# Patient Record
Sex: Male | Born: 1960 | Race: Black or African American | Hispanic: No | Marital: Married | Smoking: Former smoker
Health system: Southern US, Community
[De-identification: ages and names within clinical notes are randomized; demographics above are authoritative.]

## PROBLEM LIST (undated history)

## (undated) DIAGNOSIS — I509 Heart failure, unspecified: Secondary | ICD-10-CM

## (undated) DIAGNOSIS — I10 Essential (primary) hypertension: Secondary | ICD-10-CM

## (undated) DIAGNOSIS — I2510 Atherosclerotic heart disease of native coronary artery without angina pectoris: Secondary | ICD-10-CM

## (undated) DIAGNOSIS — G4730 Sleep apnea, unspecified: Secondary | ICD-10-CM

## (undated) DIAGNOSIS — J45909 Unspecified asthma, uncomplicated: Secondary | ICD-10-CM

## (undated) HISTORY — PX: APPENDECTOMY: SHX54

## (undated) HISTORY — PX: CORONARY ANGIOPLASTY WITH STENT PLACEMENT: SHX49

---

## 2004-01-28 ENCOUNTER — Emergency Department (HOSPITAL_COMMUNITY): Admission: EM | Admit: 2004-01-28 | Discharge: 2004-01-28 | Payer: Self-pay | Admitting: Emergency Medicine

## 2009-12-12 ENCOUNTER — Inpatient Hospital Stay (HOSPITAL_COMMUNITY): Admission: EM | Admit: 2009-12-12 | Discharge: 2009-12-16 | Payer: Self-pay | Admitting: Emergency Medicine

## 2009-12-12 ENCOUNTER — Ambulatory Visit: Payer: Self-pay | Admitting: Cardiology

## 2009-12-13 ENCOUNTER — Encounter (INDEPENDENT_AMBULATORY_CARE_PROVIDER_SITE_OTHER): Payer: Self-pay | Admitting: Internal Medicine

## 2010-01-23 ENCOUNTER — Emergency Department (HOSPITAL_COMMUNITY): Admission: EM | Admit: 2010-01-23 | Discharge: 2010-01-23 | Payer: Self-pay | Admitting: Family Medicine

## 2010-08-14 ENCOUNTER — Ambulatory Visit (INDEPENDENT_AMBULATORY_CARE_PROVIDER_SITE_OTHER): Payer: Self-pay

## 2010-08-14 ENCOUNTER — Inpatient Hospital Stay (INDEPENDENT_AMBULATORY_CARE_PROVIDER_SITE_OTHER)
Admission: RE | Admit: 2010-08-14 | Discharge: 2010-08-14 | Disposition: A | Discharge status: Home / Self Care | Payer: Self-pay | Source: Ambulatory Visit

## 2010-08-14 DIAGNOSIS — R609 Edema, unspecified: Secondary | ICD-10-CM

## 2010-08-14 DIAGNOSIS — J811 Chronic pulmonary edema: Secondary | ICD-10-CM

## 2010-08-15 LAB — POCT I-STAT, CHEM 8
BUN: 8 mg/dL (ref 6–23)
Chloride: 104 mEq/L (ref 96–112)
Creatinine, Ser: 1.5 mg/dL (ref 0.4–1.5)
Glucose, Bld: 92 mg/dL (ref 70–99)
Hemoglobin: 16.7 g/dL (ref 13.0–17.0)
Potassium: 3.5 mEq/L (ref 3.5–5.1)
Sodium: 143 mEq/L (ref 135–145)
TCO2: 29 mmol/L (ref 0–100)

## 2010-08-17 ENCOUNTER — Emergency Department (HOSPITAL_COMMUNITY): Payer: Self-pay

## 2010-08-17 ENCOUNTER — Inpatient Hospital Stay (HOSPITAL_COMMUNITY)
Admission: EM | Admit: 2010-08-17 | Discharge: 2010-08-21 | DRG: 286 | Disposition: A | Discharge status: Home / Self Care | Payer: Self-pay | Attending: Internal Medicine | Admitting: Internal Medicine

## 2010-08-17 DIAGNOSIS — E669 Obesity, unspecified: Secondary | ICD-10-CM | POA: Diagnosis present

## 2010-08-17 DIAGNOSIS — I509 Heart failure, unspecified: Secondary | ICD-10-CM | POA: Diagnosis present

## 2010-08-17 DIAGNOSIS — E785 Hyperlipidemia, unspecified: Secondary | ICD-10-CM | POA: Diagnosis present

## 2010-08-17 DIAGNOSIS — I4729 Other ventricular tachycardia: Secondary | ICD-10-CM | POA: Diagnosis not present

## 2010-08-17 DIAGNOSIS — Z7982 Long term (current) use of aspirin: Secondary | ICD-10-CM

## 2010-08-17 DIAGNOSIS — I130 Hypertensive heart and chronic kidney disease with heart failure and stage 1 through stage 4 chronic kidney disease, or unspecified chronic kidney disease: Principal | ICD-10-CM | POA: Diagnosis present

## 2010-08-17 DIAGNOSIS — I472 Ventricular tachycardia, unspecified: Secondary | ICD-10-CM | POA: Diagnosis not present

## 2010-08-17 DIAGNOSIS — Z9119 Patient's noncompliance with other medical treatment and regimen: Secondary | ICD-10-CM

## 2010-08-17 DIAGNOSIS — E662 Morbid (severe) obesity with alveolar hypoventilation: Secondary | ICD-10-CM | POA: Diagnosis present

## 2010-08-17 DIAGNOSIS — N189 Chronic kidney disease, unspecified: Principal | ICD-10-CM | POA: Diagnosis present

## 2010-08-17 DIAGNOSIS — I5033 Acute on chronic diastolic (congestive) heart failure: Secondary | ICD-10-CM | POA: Diagnosis present

## 2010-08-17 DIAGNOSIS — I43 Cardiomyopathy in diseases classified elsewhere: Secondary | ICD-10-CM | POA: Diagnosis present

## 2010-08-17 DIAGNOSIS — Z91199 Patient's noncompliance with other medical treatment and regimen due to unspecified reason: Secondary | ICD-10-CM

## 2010-08-17 DIAGNOSIS — G4733 Obstructive sleep apnea (adult) (pediatric): Secondary | ICD-10-CM | POA: Diagnosis present

## 2010-08-17 DIAGNOSIS — N183 Chronic kidney disease, stage 3 unspecified: Secondary | ICD-10-CM | POA: Diagnosis present

## 2010-08-17 LAB — URINALYSIS, ROUTINE W REFLEX MICROSCOPIC
Bilirubin Urine: NEGATIVE
Ketones, ur: NEGATIVE mg/dL
Leukocytes, UA: NEGATIVE
Nitrite: NEGATIVE
Protein, ur: NEGATIVE mg/dL
Specific Gravity, Urine: 1.012 (ref 1.005–1.030)
Urine Glucose, Fasting: NEGATIVE mg/dL

## 2010-08-17 LAB — DIFFERENTIAL
Basophils Absolute: 0 10*3/uL (ref 0.0–0.1)
Eosinophils Absolute: 0.1 10*3/uL (ref 0.0–0.7)
Eosinophils Relative: 1 % (ref 0–5)
Lymphocytes Relative: 33 % (ref 12–46)
Monocytes Absolute: 0.5 10*3/uL (ref 0.1–1.0)
Monocytes Relative: 7 % (ref 3–12)
Neutro Abs: 4 10*3/uL (ref 1.7–7.7)
Neutrophils Relative %: 58 % (ref 43–77)

## 2010-08-17 LAB — POCT CARDIAC MARKERS: CKMB, poc: 3.7 ng/mL (ref 1.0–8.0)

## 2010-08-17 LAB — COMPREHENSIVE METABOLIC PANEL
ALT: 42 U/L (ref 0–53)
Albumin: 3.7 g/dL (ref 3.5–5.2)
CO2: 26 mEq/L (ref 19–32)
Chloride: 103 mEq/L (ref 96–112)
GFR calc non Af Amer: >60 mL/min (ref >60)
Glucose, Bld: 118 mg/dL — ABNORMAL HIGH (ref 70–99)
Sodium: 140 mEq/L (ref 135–145)
Total Protein: 6.9 g/dL (ref 6.0–8.3)

## 2010-08-17 LAB — CARDIAC PANEL(CRET KIN+CKTOT+MB+TROPI)
CK, MB: 6.4 ng/mL (ref 0.3–4.0)
Relative Index: 0.3 (ref 0.0–2.5)
Total CK: 1881 U/L — ABNORMAL HIGH (ref 7–232)
Troponin I: 0.1 ng/mL — ABNORMAL HIGH (ref 0.00–0.06)

## 2010-08-17 LAB — CBC
MCV: 85.7 fL (ref 78.0–100.0)
RBC: 5.1 MIL/uL (ref 4.22–5.81)

## 2010-08-17 LAB — TSH: TSH: 1.261 u[IU]/mL (ref 0.350–4.500)

## 2010-08-17 LAB — CK TOTAL AND CKMB (NOT AT ARMC): Total CK: 1947 U/L — ABNORMAL HIGH (ref 7–232)

## 2010-08-17 LAB — D-DIMER, QUANTITATIVE: D-Dimer, Quant: <0.22 ug/mL-FEU (ref 0.00–0.48)

## 2010-08-17 LAB — TROPONIN I: Troponin I: 0.08 ng/mL — ABNORMAL HIGH (ref 0.00–0.06)

## 2010-08-17 NOTE — H&P (Signed)
NAME:  Joel Silva, SEIDMAN NO.:  [id number]  MEDICAL RECORD NO.:  [id number]           PATIENT TYPE:  E  LOCATION:  [facility]                         FACILITY:  [facility]  PHYSICIAN:  Brendia Sacks, MD    DATE OF BIRTH:  21-Sep-1960  DATE OF ADMISSION:  08/17/2010 DATE OF DISCHARGE:                             HISTORY & PHYSICAL   REFERRING PHYSICIAN:  Jerelyn Scott, MD  PRIMARY CARE PHYSICIAN:  [facility], he cannot recall the name of the physician.  CHIEF COMPLAINT:  Shortness of breath.  HISTORY OF PRESENT ILLNESS:  This is a 50 year old man who presents to the emergency room with subacute history of shortness of breath.  The patient reports shortness of breath over the last month to 6 weeks with increased dyspnea on exertion and increased orthopnea.  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  He has been out of his medication for approximately a month.  He has been on medication for hypertension in the past.  Review of the patient's record notes he was seen by cardiology in June during admission for shortness of breath as well.  At that time final diagnoses were shortness of breath, cardiac versus noncardiac with an ejection fraction of 45-50% per cardiology, however, the official report reads higher as well as poorly controlled hypertension and transaminitis.  The patient did have a component of diastolic dysfunction on echocardiogram done at that time.  It was also noted that he probably had obstructive sleep apnea, however, he did not pursue a sleep study.  The patient has had no chest pain or other symptoms.  He has just noted increased dyspnea on exertion to the point where he was having difficulty doing his job.  He was recently seen in the emergency room on the 9th and discharged home.  REVIEW OF SYSTEMS:  Negative for fever, changes to his vision, sore throat, rash, muscle aches, chest pain, nausea, vomiting,  abdominal pain, diarrhea, dysuria, bleeding.  PAST MEDICAL HISTORY: 1. Hypertension. 2. Noncompliance with medication. 3. Probable obstructive sleep apnea and obesity. 4. Hypoventilation syndrome.  PAST SURGICAL HISTORY:  Appendectomy, age 37.  ALLERGIES:  None.  SOCIAL HISTORY:  He is a nonsmoker, no drugs.  Works as a Naval architect, doing Art gallery manager, he only drinks, and he is not working.  FAMILY HISTORY:  Negative for first-degree coronary artery disease.  MEDICATIONS: 1. Lisinopril 10 mg p.o. daily. 2. Lasix 40 mg p.o. daily. 3. Potassium chloride 10 mEq p.o. daily.  He only started these     medications 3 days ago.  PHYSICAL EXAMINATION:  VITAL SIGNS:  Initial blood pressure 192/154 with a pulse of 101 and respirations of 24, temperature 97.5, most recent blood pressure 163/91, sat 98% on room air. GENERAL: Well-developed, well-nourished man in no acute distress. HEAD:  Appears to be normal. EYES:  Sclerae clear.  Pupils equal, round, reactive to light with irises, conjunctive appear normal. ENT:  Hearing is grossly normal.  Lips and tongue appear unremarkable. NECK:  Supple.  No lymphadenopathy or masses.  No thyromegaly. CHEST:  Clear to auscultation bilaterally.  No wheezes, rales, or rhonchi.  There is normal respiratory effort. EXTREMITIES:  There is 1+ bilateral lower extremity edema. ABDOMEN:  Obese, soft, nontender, nondistended. PSYCHIATRIC:  Grossly normal mood and affect.  Speech is fluent and appropriate.  He is a good historian.  IMAGING:  Chest x-ray:  Cardiomegaly and pulmonary vascular congestion. Diffuse mild interstitial prominence may reflect mild interstitial edema.  ANCILLARY STUDIES:  EKG independently reviewed February 12 at 7:43 a.m. shows normal sinus rhythm, right atrial enlargement, nonspecific ST changes compared to previous tracing from August 14, 2010, right atrial enlargement is old, nonspecific ST changes are old as  well.  LABORATORY STUDIES: 1. CBC is within normal limits. 2. Complete metabolic panel notable for an AST of 80, otherwise     unremarkable. 3. One set of cardiac markers are negative. 4. Urinalysis is negative.  ASSESSMENT/PLAN:  A 50 year old man presents with shortness of breath: 1. Uncontrolled hypertension with hypertensive urgency and shortness     of breath.  We will admit to telemetry.  His blood pressure has     been improved in the emergency room already.  He will continue on     lisinopril, Lasix, and potassium chloride.  He will need a second     agent on discharge which is likely to be hydrochlorothiazide or     Lasix. 2. Subacute shortness of breath, multifactorial.     a.     Diastolic dysfunction acute on chronic.  We will treat with      Lasix.     b.     Probable obstructive sleep apnea.  He should follow up in      the outpatient setting for this with a sleep study.     c.     Probable obesity hypoventilation syndrome.  I have advised      weight loss. 3. Noncompliance.     Brendia Sacks, MD     DG/MEDQ  D:  08/17/2010  T:  08/17/2010  Job:  [id number]  Electronically Signed by Brendia Sacks  on 08/17/2010 04:30:54 PM

## 2010-08-18 LAB — BASIC METABOLIC PANEL
Calcium: 9.5 mg/dL (ref 8.4–10.5)
Chloride: 101 mEq/L (ref 96–112)
Creatinine, Ser: 1.42 mg/dL (ref 0.4–1.5)
GFR calc Af Amer: >60 mL/min (ref >60)
Glucose, Bld: 118 mg/dL — ABNORMAL HIGH (ref 70–99)
Potassium: 3.7 mEq/L (ref 3.5–5.1)
Sodium: 142 mEq/L (ref 135–145)

## 2010-08-18 LAB — CARDIAC PANEL(CRET KIN+CKTOT+MB+TROPI)
Relative Index: 0.3 (ref 0.0–2.5)
Total CK: 1670 U/L — ABNORMAL HIGH (ref 7–232)

## 2010-08-18 LAB — LIPID PANEL
HDL: 37 mg/dL — ABNORMAL LOW (ref >39)
LDL Cholesterol: 111 mg/dL — ABNORMAL HIGH (ref 0–99)

## 2010-08-19 LAB — BASIC METABOLIC PANEL
BUN: 15 mg/dL (ref 6–23)
Calcium: 9.3 mg/dL (ref 8.4–10.5)
Creatinine, Ser: 1.52 mg/dL — ABNORMAL HIGH (ref 0.4–1.5)
GFR calc Af Amer: 60 mL/min — ABNORMAL LOW (ref >60)
GFR calc non Af Amer: 49 mL/min — ABNORMAL LOW (ref >60)
Glucose, Bld: 96 mg/dL (ref 70–99)
Potassium: 3.4 mEq/L — ABNORMAL LOW (ref 3.5–5.1)
Sodium: 141 mEq/L (ref 135–145)

## 2010-08-20 LAB — BASIC METABOLIC PANEL
Calcium: 9 mg/dL (ref 8.4–10.5)
GFR calc Af Amer: >60 mL/min (ref >60)
GFR calc non Af Amer: 52 mL/min — ABNORMAL LOW (ref >60)
Glucose, Bld: 106 mg/dL — ABNORMAL HIGH (ref 70–99)
Sodium: 139 mEq/L (ref 135–145)

## 2010-08-20 LAB — CARDIAC PANEL(CRET KIN+CKTOT+MB+TROPI)
CK, MB: 5.3 ng/mL — ABNORMAL HIGH (ref 0.3–4.0)
Troponin I: 0.08 ng/mL — ABNORMAL HIGH (ref 0.00–0.06)

## 2010-08-20 LAB — CBC
MCHC: 32.7 g/dL (ref 30.0–36.0)
MCV: 87 fL (ref 78.0–100.0)
RBC: 5.06 MIL/uL (ref 4.22–5.81)
RDW: 13.3 % (ref 11.5–15.5)
WBC: 6.6 10*3/uL (ref 4.0–10.5)

## 2010-08-20 LAB — PROTIME-INR: Prothrombin Time: 13.1 seconds (ref 11.6–15.2)

## 2010-08-20 LAB — BRAIN NATRIURETIC PEPTIDE: Pro B Natriuretic peptide (BNP): 37 pg/mL (ref 0.0–100.0)

## 2010-08-21 LAB — BASIC METABOLIC PANEL
BUN: 12 mg/dL (ref 6–23)
GFR calc non Af Amer: 54 mL/min — ABNORMAL LOW (ref >60)
Glucose, Bld: 95 mg/dL (ref 70–99)
Potassium: 4.6 mEq/L (ref 3.5–5.1)

## 2010-08-23 NOTE — Discharge Summary (Signed)
NAME:  Joel Silva, Joel Silva            ACCOUNT NO.:  [id number]  MEDICAL RECORD NO.:  [id number]           PATIENT TYPE:  I  LOCATION:  3742                         FACILITY:  [facility]  PHYSICIAN:  Lonia Blood, M.D.       DATE OF BIRTH:  12/18/1960  DATE OF ADMISSION:  08/17/2010 DATE OF DISCHARGE:  08/21/2010                              DISCHARGE SUMMARY   PRIMARY CARE PHYSICIAN:  Fleet Contras, MD  DISCHARGE DIAGNOSES: 1. Acute-on-chronic diastolic congestive heart failure. 2. Hypertension stage II. 3. Rule out obstructive sleep apnea - the patient needs an outpatient     sleep study. 4. Hypertensive cardiomyopathy.  DISCHARGE MEDICATIONS: 1. Lisinopril 40 mg daily. 2. Lasix 40 mg daily. 3. Potassium 20 mEq daily. 4. Coreg 6.25 mg twice a day. 5. Aspirin 81 mg daily. 6. Tylenol 650 mg by mouth every 4 hours as needed for pain.  CONDITION ON DISCHARGE:  Mr. Gille was discharged in good condition, alert, oriented, in no acute distress.  Vital signs on discharge show a blood pressure 127/74, heart rate 60, respirations 18, saturation 96% on room air.  The patient's discharge basic metabolic profile shows a potassium of 4.6, creatinine 1.4, and BUN of 12.  Mr. Deisher was scheduled a follow up at [facility] and Vascular with Dr. Royann Shivers on August 27, 2010, at 10:00 a.m.  PROCEDURE DURING THIS ADMISSION: 1. On August 19, 2010, the patient underwent an echocardiogram.     Findings of ejection fraction of 35% with hypokinesis, and Doppler     parameters consistent with increased left ventricular filling     pressures. 2. August 20, 2010, cardiac catheterization by Dr. Lynnea Ferrier from     [facility] with findings of ejection fraction     of 50%.  No regional wall motion abnormalities and normal-appearing     coronaries.  HISTORY AND PHYSICAL:  Refer to dictated H and P done by Dr. Irene Limbo.  HOSPITAL COURSE:  Mr. Senner is a 50 year old  gentleman with known hypertension and noncompliance was admitted from the emergency room with worsening dyspnea.  He also had uncontrolled hypertension.  Upon admission due to not taking his blood pressure medications.  The patient was also displaying clinical features consistent with obstructive sleep apnea.  During this hospitalization, Mr. Soderman was diuresed with intravenous furosemide.  His ACE inhibitor was up titrated, and he was started on the beta-blocker with Coreg, which was also increased.  He had borderline elevated troponins with the troponins 0.08 and echocardiogram showed depressed ejection fraction.  For this reason, he was taken to the cardiac catheterization lab where he had left heart catheterization, which showed normal-appearing coronaries and an ejection fraction of 50%.  We feel that this patient has a hypertensive cardiomyopathy, which can only be improved by compliance with the heart- healthy diet, compliance with his medications, and probably also treatment of his sleep apnea.  Mr. Sieling will follow up with [facility] Dr. Royann Shivers in 6 days after discharge.     Lonia Blood, M.D.     SL/MEDQ  D:  08/21/2010  T:  08/22/2010  Job:  [id number]  cc:   Thurmon Fair, MD Fleet Contras, M.D.  Electronically Signed by Lonia Blood M.D. on 08/23/2010 04:49:35 PM

## 2010-09-03 NOTE — Procedures (Signed)
  NAME:  Joel Silva, Joel Silva NO.:  [id number]  MEDICAL RECORD NO.:  [id number]           PATIENT TYPE:  I  LOCATION:  3742                         FACILITY:  [facility]  PHYSICIAN:  Ritta Slot, MD     DATE OF BIRTH:  10-21-1960  DATE OF PROCEDURE:  08/20/2010 DATE OF DISCHARGE:                           CARDIAC CATHETERIZATION   This is a left heart catheterization.  INDICATIONS:  Ms. [name] is a very pleasant 50 year old African American gentleman who is coming in with CHF exacerbation secondary to hypertensive crisis.  He had a mild elevation in his troponins, therefore, brought to cardiac catheterization laboratory for further evaluation of coronary anatomy.  Of note, his transthoracic echocardiogram did show diffuse hypokinesis with an EF around 30-35%. After informed consent, the patient was prepped and draped.  His right groin was prepped and draped in usual sterile fashion.  He received 200 mg of Versed, 50 mcg of fentanyl for IV light sedation.  Using modified Seldinger technique, a 5-French sheath was placed in RFA without difficulty.  A 15 mL of 1% lidocaine was used for local anesthesia. Through, the 5-French sheath was placed, a JL-4 catheter was placed into the left main without difficulty.  Visualization of the left circulation system was obtained in AP cranial, AP caudal and LAO caudal views.  JL-4 catheter was then exchanged over J-wire for JR-4 catheter.  The JR-4 catheter was placed in the RCA artery without difficulty.  Visualization of the RCA was obtained in the LAO cranial, RAO cranial views.  The JR-4 catheter was then exchanged over the J-wire for angled pigtail.  The angled pigtail catheter was then placed into LV without difficulty. Visualization of the LV was obtained using 36 mL of contrast at 12 mL per second in the RAO oblique view.  FINDINGS:  AO pressure 134/85, mean of 105.  LV pressure 142/30, LVEDP of 28.  LV pullback  135/70, LVEDP of 26.  AO pullback 156/40, mean of 134.  There was no grading across the aortic valve by pullback technique.  The left main was found to bifurcate into the circumflex and LAD.  There is no significant disease in the left main LAD with circumflex territory.  The right coronary artery was dominant giving rise to PLA and PDA.  A significant disease was noted in the PLA, PDA or RCA.  LV function was found to be about 50% low normal.  No mitral regurgitation.  No regional wall motion abnormality.  FINAL CONCLUSIONS:  Low normal EF, normal coronaries, continue up titrating his medications, continue diuresis.  He will be discharged home when his blood pressure has improved.     Ritta Slot, MD     HS/MEDQ  D:  08/20/2010  T:  08/21/2010  Job:  [id number]  Electronically Signed by Ritta Slot MD on 09/03/2010 02:26:51 PM

## 2010-09-22 LAB — DIFFERENTIAL
Basophils Absolute: 0 10*3/uL (ref 0.0–0.1)
Lymphocytes Relative: 39 % (ref 12–46)
Monocytes Absolute: 0.6 10*3/uL (ref 0.1–1.0)
Neutro Abs: 2.8 10*3/uL (ref 1.7–7.7)
Neutrophils Relative %: 49 % (ref 43–77)

## 2010-09-22 LAB — POCT I-STAT 3, VENOUS BLOOD GAS (G3P V)
O2 Saturation: 83 %
TCO2: 31 mmol/L (ref 0–100)
pCO2, Ven: 45.1 mmHg (ref 45.0–50.0)
pO2, Ven: 47 mmHg — ABNORMAL HIGH (ref 30.0–45.0)

## 2010-09-22 LAB — CK TOTAL AND CKMB (NOT AT ARMC)
CK, MB: 6.3 ng/mL (ref 0.3–4.0)
Relative Index: 0.2 (ref 0.0–2.5)

## 2010-09-22 LAB — BASIC METABOLIC PANEL
BUN: 14 mg/dL (ref 6–23)
CO2: 31 mEq/L (ref 19–32)
Calcium: 9.1 mg/dL (ref 8.4–10.5)
Chloride: 101 mEq/L (ref 96–112)
Chloride: 101 mEq/L (ref 96–112)
GFR calc Af Amer: >60 mL/min (ref >60)
GFR calc Af Amer: >60 mL/min (ref >60)
GFR calc non Af Amer: 50 mL/min — ABNORMAL LOW (ref >60)
GFR calc non Af Amer: 56 mL/min — ABNORMAL LOW (ref >60)
Glucose, Bld: 103 mg/dL — ABNORMAL HIGH (ref 70–99)
Potassium: 3 mEq/L — ABNORMAL LOW (ref 3.5–5.1)
Potassium: 3.2 mEq/L — ABNORMAL LOW (ref 3.5–5.1)
Potassium: 3.8 mEq/L (ref 3.5–5.1)
Sodium: 140 mEq/L (ref 135–145)
Sodium: 140 mEq/L (ref 135–145)

## 2010-09-22 LAB — POCT I-STAT, CHEM 8
BUN: 14 mg/dL (ref 6–23)
Creatinine, Ser: 1.3 mg/dL (ref 0.4–1.5)
Potassium: 3.3 mEq/L — ABNORMAL LOW (ref 3.5–5.1)
Sodium: 143 mEq/L (ref 135–145)

## 2010-09-22 LAB — POCT CARDIAC MARKERS: Myoglobin, poc: >500 ng/mL (ref 12–200)

## 2010-09-22 LAB — BRAIN NATRIURETIC PEPTIDE: Pro B Natriuretic peptide (BNP): 55 pg/mL (ref 0.0–100.0)

## 2010-09-22 LAB — CBC
HCT: 40.6 % (ref 39.0–52.0)
HCT: 40.8 % (ref 39.0–52.0)
HCT: 41.2 % (ref 39.0–52.0)
MCV: 89.1 fL (ref 78.0–100.0)
MCV: 89.6 fL (ref 78.0–100.0)
Platelets: 156 10*3/uL (ref 150–400)
Platelets: 156 10*3/uL (ref 150–400)
Platelets: 175 10*3/uL (ref 150–400)
RBC: 4.55 MIL/uL (ref 4.22–5.81)
RDW: 13.8 % (ref 11.5–15.5)
WBC: 5.8 10*3/uL (ref 4.0–10.5)
WBC: 7.1 10*3/uL (ref 4.0–10.5)

## 2010-09-22 LAB — COMPREHENSIVE METABOLIC PANEL
Albumin: 4.1 g/dL (ref 3.5–5.2)
BUN: 13 mg/dL (ref 6–23)
Creatinine, Ser: 1.33 mg/dL (ref 0.4–1.5)
Total Protein: 7.3 g/dL (ref 6.0–8.3)

## 2010-09-22 LAB — CK
Total CK: 2799 U/L — ABNORMAL HIGH (ref 7–232)
Total CK: 3077 U/L — ABNORMAL HIGH (ref 7–232)
Total CK: 3255 U/L — ABNORMAL HIGH (ref 7–232)

## 2010-09-22 LAB — LIPID PANEL
Cholesterol: 177 mg/dL (ref 0–200)
HDL: 37 mg/dL — ABNORMAL LOW (ref >39)
LDL Cholesterol: 114 mg/dL — ABNORMAL HIGH (ref 0–99)
Triglycerides: 130 mg/dL (ref <150)

## 2011-09-26 ENCOUNTER — Encounter (HOSPITAL_COMMUNITY): Payer: Self-pay | Admitting: Emergency Medicine

## 2011-09-26 ENCOUNTER — Emergency Department (HOSPITAL_COMMUNITY): Payer: BC Managed Care – PPO

## 2011-09-26 ENCOUNTER — Ambulatory Visit (INDEPENDENT_AMBULATORY_CARE_PROVIDER_SITE_OTHER): Payer: BC Managed Care – PPO | Admitting: Family Medicine

## 2011-09-26 ENCOUNTER — Ambulatory Visit: Payer: BC Managed Care – PPO

## 2011-09-26 ENCOUNTER — Inpatient Hospital Stay (HOSPITAL_COMMUNITY)
Admission: EM | Admit: 2011-09-26 | Discharge: 2011-09-29 | DRG: 127 | Disposition: A | Discharge status: Home / Self Care | Payer: BC Managed Care – PPO | Attending: Internal Medicine | Admitting: Internal Medicine

## 2011-09-26 ENCOUNTER — Other Ambulatory Visit: Payer: Self-pay

## 2011-09-26 VITALS — BP 153/108 | HR 94 | Temp 97.7°F | Resp 20 | Ht 71.0 in | Wt 295.0 lb

## 2011-09-26 DIAGNOSIS — Z6838 Body mass index (BMI) 38.0-38.9, adult: Secondary | ICD-10-CM

## 2011-09-26 DIAGNOSIS — R9431 Abnormal electrocardiogram [ECG] [EKG]: Secondary | ICD-10-CM

## 2011-09-26 DIAGNOSIS — I428 Other cardiomyopathies: Secondary | ICD-10-CM | POA: Diagnosis present

## 2011-09-26 DIAGNOSIS — R0602 Shortness of breath: Secondary | ICD-10-CM

## 2011-09-26 DIAGNOSIS — I10 Essential (primary) hypertension: Secondary | ICD-10-CM | POA: Diagnosis present

## 2011-09-26 DIAGNOSIS — N182 Chronic kidney disease, stage 2 (mild): Secondary | ICD-10-CM

## 2011-09-26 DIAGNOSIS — I214 Non-ST elevation (NSTEMI) myocardial infarction: Secondary | ICD-10-CM

## 2011-09-26 DIAGNOSIS — J45909 Unspecified asthma, uncomplicated: Secondary | ICD-10-CM | POA: Diagnosis present

## 2011-09-26 DIAGNOSIS — Z91199 Patient's noncompliance with other medical treatment and regimen due to unspecified reason: Secondary | ICD-10-CM

## 2011-09-26 DIAGNOSIS — I509 Heart failure, unspecified: Secondary | ICD-10-CM | POA: Diagnosis present

## 2011-09-26 DIAGNOSIS — G4730 Sleep apnea, unspecified: Secondary | ICD-10-CM | POA: Diagnosis present

## 2011-09-26 DIAGNOSIS — I129 Hypertensive chronic kidney disease with stage 1 through stage 4 chronic kidney disease, or unspecified chronic kidney disease: Secondary | ICD-10-CM | POA: Diagnosis present

## 2011-09-26 DIAGNOSIS — G4733 Obstructive sleep apnea (adult) (pediatric): Secondary | ICD-10-CM | POA: Diagnosis present

## 2011-09-26 DIAGNOSIS — I5043 Acute on chronic combined systolic (congestive) and diastolic (congestive) heart failure: Principal | ICD-10-CM | POA: Diagnosis present

## 2011-09-26 DIAGNOSIS — I429 Cardiomyopathy, unspecified: Secondary | ICD-10-CM | POA: Diagnosis present

## 2011-09-26 DIAGNOSIS — E876 Hypokalemia: Secondary | ICD-10-CM | POA: Diagnosis present

## 2011-09-26 DIAGNOSIS — Z9119 Patient's noncompliance with other medical treatment and regimen: Secondary | ICD-10-CM

## 2011-09-26 HISTORY — DX: Sleep apnea, unspecified: G47.30

## 2011-09-26 HISTORY — DX: Heart failure, unspecified: I50.9

## 2011-09-26 HISTORY — DX: Essential (primary) hypertension: I10

## 2011-09-26 LAB — DIFFERENTIAL
Basophils Relative: 0 % (ref 0–1)
Monocytes Absolute: 0.4 10*3/uL (ref 0.1–1.0)
Monocytes Relative: 6 % (ref 3–12)
Neutro Abs: 3.9 10*3/uL (ref 1.7–7.7)

## 2011-09-26 LAB — CBC
HCT: 39.5 % (ref 39.0–52.0)
Hemoglobin: 13.1 g/dL (ref 13.0–17.0)
MCH: 28.5 pg (ref 26.0–34.0)
MCHC: 33.2 g/dL (ref 30.0–36.0)
MCV: 86.1 fL (ref 78.0–100.0)

## 2011-09-26 LAB — COMPREHENSIVE METABOLIC PANEL
Albumin: 3.4 g/dL — ABNORMAL LOW (ref 3.5–5.2)
BUN: 17 mg/dL (ref 6–23)
Chloride: 105 mEq/L (ref 96–112)
Creatinine, Ser: 1.33 mg/dL (ref 0.50–1.35)
GFR calc non Af Amer: 62 mL/min — ABNORMAL LOW (ref >90)
Total Bilirubin: 1 mg/dL (ref 0.3–1.2)

## 2011-09-26 LAB — PRO B NATRIURETIC PEPTIDE: Pro B Natriuretic peptide (BNP): 916 pg/mL — ABNORMAL HIGH (ref 0–125)

## 2011-09-26 LAB — D-DIMER, QUANTITATIVE: D-Dimer, Quant: 0.61 ug/mL-FEU — ABNORMAL HIGH (ref 0.00–0.48)

## 2011-09-26 MED ORDER — SODIUM CHLORIDE 0.9 % IV SOLN
INTRAVENOUS | Status: DC
  Administered 2011-09-26: 23:00:00 via INTRAVENOUS

## 2011-09-26 MED ORDER — ASPIRIN 81 MG PO CHEW
324.0000 mg | CHEWABLE_TABLET | Freq: Once | ORAL | Status: AC
  Administered 2011-09-26: 324 mg via ORAL

## 2011-09-26 MED ORDER — ALBUTEROL SULFATE (5 MG/ML) 0.5% IN NEBU
5.0000 mg | INHALATION_SOLUTION | Freq: Once | RESPIRATORY_TRACT | Status: AC
  Administered 2011-09-26: 5 mg via RESPIRATORY_TRACT
  Filled 2011-09-26: qty 1

## 2011-09-26 MED ORDER — POTASSIUM CHLORIDE CRYS ER 20 MEQ PO TBCR
40.0000 meq | EXTENDED_RELEASE_TABLET | Freq: Once | ORAL | Status: AC
  Administered 2011-09-26: 40 meq via ORAL
  Filled 2011-09-26: qty 2

## 2011-09-26 MED ORDER — IPRATROPIUM BROMIDE 0.02 % IN SOLN
0.5000 mg | Freq: Once | RESPIRATORY_TRACT | Status: AC
  Administered 2011-09-26: 0.5 mg via RESPIRATORY_TRACT
  Filled 2011-09-26: qty 2.5

## 2011-09-26 MED ORDER — FUROSEMIDE 10 MG/ML IJ SOLN
60.0000 mg | Freq: Once | INTRAMUSCULAR | Status: AC
  Administered 2011-09-26: 60 mg via INTRAVENOUS
  Filled 2011-09-26: qty 6

## 2011-09-26 MED ORDER — ACETAMINOPHEN 500 MG PO TABS
1000.0000 mg | ORAL_TABLET | Freq: Once | ORAL | Status: AC
  Administered 2011-09-26: 1000 mg via ORAL
  Filled 2011-09-26: qty 2

## 2011-09-26 MED ORDER — ACETAMINOPHEN 325 MG PO TABS
ORAL_TABLET | ORAL | Status: AC
  Administered 2011-09-26: 1000 mg via ORAL
  Filled 2011-09-26: qty 3

## 2011-09-26 MED ORDER — NITROGLYCERIN 2 % TD OINT
1.0000 [in_us] | TOPICAL_OINTMENT | Freq: Once | TRANSDERMAL | Status: AC
  Administered 2011-09-26: 1 [in_us] via TOPICAL
  Filled 2011-09-26: qty 1

## 2011-09-26 NOTE — Progress Notes (Signed)
  Subjective:    Patient ID: Joel Silva, male    DOB: [birth year], 51 y.o.   MRN: [id number]  HPI Mr. Jeanty presents today with 1 month of SOB and DOE with chest tightness.  No angina.  No diaphoresis.  No acute event today but symptoms more prominent today. Sleeping with 1-4 pillows at night. SOB keeping him awake at night. No cough or edema Has a h/o HTN that he has not been compliant with.  Admitted 2/12 had cath and stents.  No follow up per pt. He is not currently taking any medications.   Non smoker Truck driver   Review of Systems As above    Objective:   Physical Exam Filed Vitals:   09/26/11 1753  BP: 153/108  Pulse: 94  Temp: 97.7 F (36.5 C)  Resp: 20     Pt in NAD in exam room CTA B RRR without m/r/g No edema         Assessment & Plan:  EKG changes HTN SOB DOE CHF   IV Oxygen Monitor ASA Transport to [facility] by EMS  Discussed with Dr. Conley Rolls Dr. Conley Rolls spoke with Dr. Vida Rigger at [facility] ER

## 2011-09-26 NOTE — ED Notes (Signed)
PER EMS- Pt coming from Urgent Care. Pt states that he has been having increasing shortness of breath over the last three weeks. Denies pain and nausea. Patient is a long distance truck driver. Denies drug allergies. Patient is 200/120, history of HTN, currently not taking HTN medication.

## 2011-09-26 NOTE — ED Provider Notes (Cosign Needed)
History     CSN: [id number]  Arrival date & time 09/26/11  1940   First MD Initiated Contact with Patient 09/26/11 2006      Chief Complaint  Patient presents with  . Shortness of Breath    (Consider location/radiation/quality/duration/timing/severity/associated sxs/prior treatment) HPI  Patient relates he started getting shortness of breath 3 weeks ago. He's had a dry cough without fever. He states he has been wheezing and hears gurgling in his chest. He denies any swelling of his extremities or pain in his extremities. He denies chest pain or sore throat. He states he has had some clear rhinorrhea. He states he has a history of asthma as a child. He also has a history of congestive heart failure however he has not taken any of his medicines for about a year.  PCP Dr. Concepcion Elk Cardiologist Dr. [facility] vascular  Past Medical History  Diagnosis Date  . Hypertension   . CHF (congestive heart failure)   . Sleep apnea     History reviewed. No pertinent past surgical history.  No family history on file.  History  Substance Use Topics  . Smoking status: Former Smoker -- 2 years quit as teenager  . Smokeless tobacco: Not on file  . 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    Allergies  Review of patient's allergies indicates no known allergies.  Home Medications  No current outpatient prescriptions on file. Stopped taking all meds about a year ago  BP 153/104  Pulse 81  Temp(Src) 97.5 F (36.4 C) (Oral)  Resp 22  SpO2 99%  Vital signs normal except hypertension   Physical Exam  Constitutional: He appears well-developed and well-nourished. He is cooperative.  HENT:  Head: Normocephalic and atraumatic.  Eyes: Conjunctivae and EOM are normal. Pupils are equal, round, and reactive to light.  Neck: Normal range of motion and full passive range of motion without pain.  Neck supple.  Cardiovascular: Normal rate, regular rhythm and normal heart sounds.   Pulmonary/Chest: Effort normal. Not tachypneic.       Patient has diffuse diminished breath sounds with mild retractions  Neurological: He has normal strength and normal reflexes. No cranial nerve deficit.  Skin: Skin is warm, dry and intact.  Psychiatric: He has a normal mood and affect. His speech is normal and behavior is normal. Thought content normal.    ED Course  Procedures (including critical care time)   Medications  nitroGLYCERIN (NITROGLYN) 2 % ointment 1 inch (1 inch Topical Given 09/26/11 2113)  furosemide (LASIX) injection 60 mg (60 mg Intravenous Given 09/26/11 2106)  albuterol (PROVENTIL) (5 MG/ML) 0.5% nebulizer solution 5 mg (5 mg Nebulization Given 09/26/11 2059)  ipratropium (ATROVENT) nebulizer solution 0.5 mg (0.5 mg Nebulization Given 09/26/11 2059)  acetaminophen (TYLENOL) tablet 1,000 mg (1000 mg Oral Given 09/26/11 2122)  potassium chloride SA (K-DUR,KLOR-CON) CR tablet 40 mEq (40 mEq Oral Given 09/26/11 2149)   22:32 Dr Mikeal Hawthorne, admit to team 5, tele  Results for orders placed during the hospital encounter of 09/26/11  CBC      Component Value Range   WBC 7.3  4.0 - 10.5 (K/uL)   RBC 4.59  4.22 - 5.81 (MIL/uL)   Hemoglobin 13.1  13.0 - 17.0 (g/dL)   HCT 40.9  81.1 - 91.4 (%)   MCV 86.1  78.0 - 100.0 (fL)   MCH 28.5  26.0 -  34.0 (pg)   MCHC 33.2  30.0 - 36.0 (g/dL)   RDW 04.5  40.9 - 81.1 (%)   Platelets 147 (*) 150 - 400 (K/uL)  DIFFERENTIAL      Component Value Range   Neutrophils Relative 53  43 - 77 (%)   Neutro Abs 3.9  1.7 - 7.7 (K/uL)   Lymphocytes Relative 39  12 - 46 (%)   Lymphs Abs 2.9  0.7 - 4.0 (K/uL)   Monocytes Relative 6  3 - 12 (%)   Monocytes Absolute 0.4  0.1 - 1.0 (K/uL)   Eosinophils Relative 1  0 - 5 (%)   Eosinophils Absolute 0.1  0.0 - 0.7 (K/uL)   Basophils Relative 0  0 - 1 (%)   Basophils Absolute 0.0  0.0 - 0.1 (K/uL)  COMPREHENSIVE  METABOLIC PANEL      Component Value Range   Sodium 141  135 - 145 (mEq/L)   Potassium 3.3 (*) 3.5 - 5.1 (mEq/L)   Chloride 105  96 - 112 (mEq/L)   CO2 27  19 - 32 (mEq/L)   Glucose, Bld 105 (*) 70 - 99 (mg/dL)   BUN 17  6 - 23 (mg/dL)   Creatinine, Ser 9.14  0.50 - 1.35 (mg/dL)   Calcium 9.0  8.4 - 78.2 (mg/dL)   Total Protein 7.1  6.0 - 8.3 (g/dL)   Albumin 3.4 (*) 3.5 - 5.2 (g/dL)   AST 41 (*) 0 - 37 (U/L)   ALT 42  0 - 53 (U/L)   Alkaline Phosphatase 62  39 - 117 (U/L)   Total Bilirubin 1.0  0.3 - 1.2 (mg/dL)   GFR calc non Af Amer 62 (*) >90 (mL/min)   GFR calc Af Amer 72 (*) >90 (mL/min)  PRO B NATRIURETIC PEPTIDE      Component Value Range   Pro B Natriuretic peptide (BNP) 916.0 (*) 0 - 125 (pg/mL)  D-DIMER, QUANTITATIVE      Component Value Range   D-Dimer, Quant 0.61 (*) 0.00 - 0.48 (ug/mL-FEU)  POCT I-STAT TROPONIN I      Component Value Range   Troponin i, poc 0.15 (*) 0.00 - 0.08 (ng/mL)   Comment NOTIFIED PHYSICIAN     Comment 3            Laboratory interpretation all normal except positive troponin, elevated BNP .  Dg Chest Port 1 View  09/26/2011  *RADIOLOGY REPORT*  Clinical Data: Short of breath.  Cough.  Asthma.  Coronary artery disease.  PORTABLE CHEST - 1 VIEW  Comparison: 08/17/2010  Findings: Cardiomegaly and chronic pulmonary vascular congestion are stable.  No evidence of acute infiltrate or edema.  No evidence of pleural effusion.  No mass or lymphadenopathy identified.  IMPRESSION: Stable cardiomegaly and pulmonary venous hypertension.  No acute findings.  Original Report Authenticated By: Danae Orleans, M.D.     Date: 09/26/2011  Rate: 83  Rhythm: normal sinus rhythm  QRS Axis: normal  Intervals: normal  ST/T Wave abnormalities: nonspecific ST/T changes  Conduction Disutrbances:nonspecific intraventricular conduction delay and LVH  Narrative Interpretation:   Old EKG Reviewed: changes noted from 03/21/2011 strain pattern now present    1.  Congestive heart failure   2. Hypertension   3. Hypokalemia   4. NSTEMI (non-ST elevated myocardial infarction)     Plan admission  Devoria Albe, MD, FACEP   MDM          Ward Givens, MD 09/26/11 2241

## 2011-09-27 LAB — CBC
MCH: 28.6 pg (ref 26.0–34.0)
MCHC: 32.7 g/dL (ref 30.0–36.0)
MCV: 87.4 fL (ref 78.0–100.0)
Platelets: 162 10*3/uL (ref 150–400)
RDW: 13.5 % (ref 11.5–15.5)

## 2011-09-27 LAB — BASIC METABOLIC PANEL
BUN: 16 mg/dL (ref 6–23)
CO2: 27 mEq/L (ref 19–32)
Chloride: 104 mEq/L (ref 96–112)
Creatinine, Ser: 1.39 mg/dL — ABNORMAL HIGH (ref 0.50–1.35)
Glucose, Bld: 102 mg/dL — ABNORMAL HIGH (ref 70–99)

## 2011-09-27 LAB — CARDIAC PANEL(CRET KIN+CKTOT+MB+TROPI)
CK, MB: 6.1 ng/mL (ref 0.3–4.0)
CK, MB: 5.6 ng/mL — ABNORMAL HIGH (ref 0.3–4.0)
Relative Index: 0.4 (ref 0.0–2.5)
Total CK: 1456 U/L — ABNORMAL HIGH (ref 7–232)
Troponin I: <0.3 ng/mL (ref <0.30)
Troponin I: <0.3 ng/mL (ref <0.30)

## 2011-09-27 LAB — TSH: TSH: 1.89 u[IU]/mL (ref 0.350–4.500)

## 2011-09-27 MED ORDER — SODIUM CHLORIDE 0.9 % IV SOLN: 250.0000 mL | INTRAVENOUS | Status: DC | PRN

## 2011-09-27 MED ORDER — SODIUM CHLORIDE 0.9 % IJ SOLN: 3.0000 mL | INTRAMUSCULAR | Status: DC | PRN

## 2011-09-27 MED ORDER — ACETAMINOPHEN 325 MG PO TABS: 650.0000 mg | ORAL_TABLET | ORAL | Status: DC | PRN

## 2011-09-27 MED ORDER — LISINOPRIL 10 MG PO TABS
10.0000 mg | ORAL_TABLET | Freq: Every day | ORAL | Status: DC
  Administered 2011-09-27 – 2011-09-29 (×3): 10 mg via ORAL
  Filled 2011-09-27 (×3): qty 1

## 2011-09-27 MED ORDER — ENOXAPARIN SODIUM 40 MG/0.4ML ~~LOC~~ SOLN
40.0000 mg | Freq: Every day | SUBCUTANEOUS | Status: DC
  Administered 2011-09-27 – 2011-09-29 (×3): 40 mg via SUBCUTANEOUS
  Filled 2011-09-27 (×3): qty 0.4

## 2011-09-27 MED ORDER — SODIUM CHLORIDE 0.9 % IJ SOLN
3.0000 mL | Freq: Two times a day (BID) | INTRAMUSCULAR | Status: DC
  Administered 2011-09-27 – 2011-09-29 (×5): 3 mL via INTRAVENOUS

## 2011-09-27 MED ORDER — FUROSEMIDE 10 MG/ML IJ SOLN
40.0000 mg | Freq: Two times a day (BID) | INTRAMUSCULAR | Status: DC
  Administered 2011-09-27 – 2011-09-29 (×5): 40 mg via INTRAVENOUS
  Filled 2011-09-27 (×7): qty 4

## 2011-09-27 MED ORDER — ALBUTEROL SULFATE (5 MG/ML) 0.5% IN NEBU
2.5000 mg | INHALATION_SOLUTION | RESPIRATORY_TRACT | Status: DC | PRN
  Administered 2011-09-27 – 2011-09-28 (×4): 2.5 mg via RESPIRATORY_TRACT
  Filled 2011-09-27 (×4): qty 0.5

## 2011-09-27 MED ORDER — ONDANSETRON HCL 4 MG/2ML IJ SOLN: 4.0000 mg | Freq: Four times a day (QID) | INTRAMUSCULAR | Status: DC | PRN

## 2011-09-27 MED ORDER — METOPROLOL TARTRATE 25 MG PO TABS
25.0000 mg | ORAL_TABLET | Freq: Two times a day (BID) | ORAL | Status: DC
  Administered 2011-09-27 – 2011-09-28 (×4): 25 mg via ORAL
  Filled 2011-09-27 (×6): qty 1

## 2011-09-27 MED ORDER — ASPIRIN 81 MG PO CHEW
324.0000 mg | CHEWABLE_TABLET | Freq: Once | ORAL | Status: AC
  Administered 2011-09-27: 324 mg via ORAL
  Filled 2011-09-27: qty 1
  Filled 2011-09-27: qty 3

## 2011-09-27 NOTE — Progress Notes (Signed)
Cosign for Alan Tripp RN assessments, med admin, I/O, and notes 

## 2011-09-27 NOTE — Progress Notes (Signed)
Subjective: Patient states that he has known hypertension and congestive heart failure but has been without his medications for greater than a year due to financial reasons. According to the patient he does not have hyperlipidemia or diabetes. He states that he feels much better today than he did on admission yesterday. Objective: Filed Vitals:   09/27/11 0546 09/27/11 0820 09/27/11 1046 09/27/11 1450  BP:  135/87 154/91 137/100  Pulse:    73  Temp:    97.3 F (36.3 C)  TempSrc:    Oral  Resp:    20  Height:      Weight:      SpO2: 96%   97%   Weight change:   Intake/Output Summary (Last 24 hours) at 09/27/11 1743 Last data filed at 09/27/11 1300  Gross per 24 hour  Intake    837 ml  Output   3325 ml  Net  -2488 ml    General: Alert, awake, oriented x3, in no acute distress.  HEENT: [place]/AT PEERL, EOMI Neck: Trachea midline,  no masses, no thyromegal,y no JVD, no carotid bruit OROPHARYNX:  Moist, No exudate/ erythema/lesions.  Heart: Regular rate and rhythm, without murmurs, rubs, gallops, PMI non-displaced, no heaves or thrills on palpation.  Lungs: Clear to auscultation, no wheezing or rhonchi noted. No increased vocal fremitus resonant to percussion  Abdomen: Soft, nontender, nondistended, positive bowel sounds, no masses no hepatosplenomegaly noted..  Neuro: No focal neurological deficits noted cranial nerves II through XII grossly intact. DTRs 2+ bilaterally upper and lower extremities. Strength 5/5 in bilateral upper and lower extremities. Musculoskeletal: No warm swelling or erythema around joints, no spinal tenderness noted. Psychiatric: Patient alert and oriented x3, good insight and cognition, good recent to remote recall. Lymph node survey: No cervical axillary or inguinal lymphadenopathy noted.     Lab Results:  [facility] 09/27/11 0535 09/26/11 2045  NA 140 141  K 3.4* 3.3*  CL 104 105  CO2 27 27  GLUCOSE 102* 105*  BUN 16 17  CREATININE 1.39* 1.33  CALCIUM  8.9 9.0  MG 2.2 --  PHOS -- --    [facility] 09/26/11 2045  AST 41*  ALT 42  ALKPHOS 62  BILITOT 1.0  PROT 7.1  ALBUMIN 3.4*   No results found for this basename: LIPASE:2,AMYLASE:2 in the last 72 hours  Basename 09/27/11 0535 09/26/11 2045  WBC 6.7 7.3  NEUTROABS -- 3.9  HGB 13.6 13.1  HCT 41.6 39.5  MCV 87.4 86.1  PLT 162 147*    Basename 09/27/11 0943 09/27/11 0246  CKTOTAL 1600* 1732*  CKMB 6.1* 6.2*  CKMBINDEX -- --  TROPONINI <0.30 <0.30   No components found with this basename: POCBNP:3  Basename 09/26/11 2045  DDIMER 0.61*   No results found for this basename: HGBA1C:2 in the last 72 hours No results found for this basename: CHOL:2,HDL:2,LDLCALC:2,TRIG:2,CHOLHDL:2,LDLDIRECT:2 in the last 72 hours  Basename 09/27/11 0535  TSH 1.890  T4TOTAL --  T3FREE --  THYROIDAB --   No results found for this basename: VITAMINB12:2,FOLATE:2,FERRITIN:2,TIBC:2,IRON:2,RETICCTPCT:2 in the last 72 hours  Micro Results: No results found for this or any previous visit (from the past 240 hour(s)).  Studies/Results: Dg Chest Port 1 View  09/26/2011  *RADIOLOGY REPORT*  Clinical Data: Short of breath.  Cough.  Asthma.  Coronary artery disease.  PORTABLE CHEST - 1 VIEW  Comparison: 08/17/2010  Findings: Cardiomegaly and chronic pulmonary vascular congestion are stable.  No evidence of acute infiltrate or edema.  No evidence of  pleural effusion.  No mass or lymphadenopathy identified.  IMPRESSION: Stable cardiomegaly and pulmonary venous hypertension.  No acute findings.  Original Report Authenticated By: Danae Orleans, M.D.    Medications: I have reviewed the patient's current medications. Scheduled Meds:   . acetaminophen  1,000 mg Oral Once  . albuterol  5 mg Nebulization Once  . aspirin  324 mg Oral Once  . enoxaparin  40 mg Subcutaneous Daily  . furosemide  40 mg Intravenous BID  . furosemide  60 mg Intravenous Once  . ipratropium  0.5 mg Nebulization Once  .  lisinopril  10 mg Oral Daily  . metoprolol tartrate  25 mg Oral BID  . nitroGLYCERIN  1 inch Topical Once  . potassium chloride  40 mEq Oral Once  . sodium chloride  3 mL Intravenous Q12H   Continuous Infusions:   . sodium chloride 50 mL/hr at 09/26/11 2313   PRN Meds:.sodium chloride, acetaminophen, albuterol, ondansetron (ZOFRAN) IV, sodium chloride Assessment/Plan: Patient Active Hospital Problem List: CHF with unknown LVEF (09/26/2011)   Assessment: Patient will continue on IV Lasix. Lisinopril has been added we will monitor his blood pressures and is possibly lisinopril. The patient has the echocardiogram pending to evaluate his left ventricular function.   HTN (hypertension) (09/26/2011)   Assessment: Blood pressures have been good throughout the day however slowly creeping up this evening particularly the diastolic blood pressure. Continue to monitor and titrate medications.   Sleep apnea (09/26/2011)   Assessment: It is unclear as to whether or not the patient has had a formal studies and CPAP however presently he is not on CPAP. I last overnight titration the patient is hospitalized evaluate whether or not he's having hypoxemia at night.   Hypokalemia (09/26/2011)   Assessment: This is been repleted orally continue to monitor his electrolytes       LOS: 1 day

## 2011-09-27 NOTE — Progress Notes (Signed)
Pt K 3.4 this AM. 40 IV lasix schedule BID. MD notified. Will continue to monitor.

## 2011-09-27 NOTE — Progress Notes (Signed)
"  Living better with heart failure" education packet given to pt. Discussed importance of daily weights, knowing "how I feel zones, low salt intake, and increasing activity. 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 Pt states back the importance of low sodium and foods he could eliminate. Will continue to educate.

## 2011-09-27 NOTE — Plan of Care (Signed)
Problem: Phase II Progression Outcomes Goal: Walk in hall or up in chair TID Outcome: Completed/Met Date Met:  09/27/11 OOB to chair

## 2011-09-27 NOTE — Plan of Care (Signed)
Problem: Phase I Progression Outcomes Goal: EF % per last Echo/documented,Core Reminder form on chart Outcome: Completed/Met Date Met:  09/27/11 55-60% (2011)

## 2011-09-27 NOTE — H&P (Signed)
Joel Silva is an 51 y.o. male.   Chief Complaint: Shortness of Breath HPI: 51 yo man with history of HTN and CHF who has not been taking his medications for 1 year due to cost here with progressive shortness of breath and weakness for 3 days. Has been seeing Dr Concepcion Elk as well as [facility] in the past. He denies pedal swelling but has recent cold followed by his SOB. No NVD, no abdominal pain. Findings in the Er suggest CHF exacerbation again.  Past Medical History  Diagnosis Date  . Hypertension   . CHF (congestive heart failure)   . Sleep apnea     History reviewed. No pertinent past surgical history.  History reviewed. No pertinent family history. Social History:  reports that he has quit smoking. He does not have any smokeless tobacco history on file. His alcohol and drug histories not on file.  Allergies: No Known Allergies  Medications Prior to Admission  Medication Dose Route Frequency Provider Last Rate Last Dose  . 0.9 %  sodium chloride infusion   Intravenous Continuous Ward Givens, MD 50 mL/hr at 09/26/11 2313    . 0.9 %  sodium chloride infusion  250 mL Intravenous PRN Rometta Emery, MD      . acetaminophen (TYLENOL) tablet 1,000 mg  1,000 mg Oral Once Ward Givens, MD   1,000 mg at 09/26/11 2122  . acetaminophen (TYLENOL) tablet 650 mg  650 mg Oral Q4H PRN Rometta Emery, MD      . albuterol (PROVENTIL) (5 MG/ML) 0.5% nebulizer solution 2.5 mg  2.5 mg Nebulization Q4H PRN Rolan Lipa, NP      . albuterol (PROVENTIL) (5 MG/ML) 0.5% nebulizer solution 5 mg  5 mg Nebulization Once Ward Givens, MD   5 mg at 09/26/11 2059  . aspirin chewable tablet 324 mg  324 mg Oral Once Rometta Emery, MD   324 mg at 09/27/11 0413  . enoxaparin (LOVENOX) injection 40 mg  40 mg Subcutaneous Daily Rometta Emery, MD      . furosemide (LASIX) injection 40 mg  40 mg Intravenous BID Rometta Emery, MD      . furosemide (LASIX) injection 60 mg  60 mg Intravenous Once Ward Givens, MD   60 mg at 09/26/11 2106  . ipratropium (ATROVENT) nebulizer solution 0.5 mg  0.5 mg Nebulization Once Ward Givens, MD   0.5 mg at 09/26/11 2059  . lisinopril (PRINIVIL,ZESTRIL) tablet 10 mg  10 mg Oral Daily Rometta Emery, MD      . metoprolol tartrate (LOPRESSOR) tablet 25 mg  25 mg Oral BID Rometta Emery, MD   25 mg at 09/27/11 0414  . nitroGLYCERIN (NITROGLYN) 2 % ointment 1 inch  1 inch Topical Once Ward Givens, MD   1 inch at 09/26/11 2113  . ondansetron (ZOFRAN) injection 4 mg  4 mg Intravenous Q6H PRN Rometta Emery, MD      . potassium chloride SA (K-DUR,KLOR-CON) CR tablet 40 mEq  40 mEq Oral Once Ward Givens, MD   40 mEq at 09/26/11 2149  . sodium chloride 0.9 % injection 3 mL  3 mL Intravenous Q12H Rometta Emery, MD      . sodium chloride 0.9 % injection 3 mL  3 mL Intravenous PRN Rometta Emery, MD       No current outpatient prescriptions on file as of 09/27/2011.    Results for orders  placed during the hospital encounter of 09/26/11 (from the past 48 hour(s))  CBC     Status: Abnormal   Collection Time   09/26/11  8:45 PM      Component Value Range Comment   WBC 7.3  4.0 - 10.5 (K/uL)    RBC 4.59  4.22 - 5.81 (MIL/uL)    Hemoglobin 13.1  13.0 - 17.0 (g/dL)    HCT 16.1  09.6 - 04.5 (%)    MCV 86.1  78.0 - 100.0 (fL)    MCH 28.5  26.0 - 34.0 (pg)    MCHC 33.2  30.0 - 36.0 (g/dL)    RDW 40.9  81.1 - 91.4 (%)    Platelets 147 (*) 150 - 400 (K/uL)   DIFFERENTIAL     Status: Normal   Collection Time   09/26/11  8:45 PM      Component Value Range Comment   Neutrophils Relative 53  43 - 77 (%)    Neutro Abs 3.9  1.7 - 7.7 (K/uL)    Lymphocytes Relative 39  12 - 46 (%)    Lymphs Abs 2.9  0.7 - 4.0 (K/uL)    Monocytes Relative 6  3 - 12 (%)    Monocytes Absolute 0.4  0.1 - 1.0 (K/uL)    Eosinophils Relative 1  0 - 5 (%)    Eosinophils Absolute 0.1  0.0 - 0.7 (K/uL)    Basophils Relative 0  0 - 1 (%)    Basophils Absolute 0.0  0.0 - 0.1 (K/uL)     COMPREHENSIVE METABOLIC PANEL     Status: Abnormal   Collection Time   09/26/11  8:45 PM      Component Value Range Comment   Sodium 141  135 - 145 (mEq/L)    Potassium 3.3 (*) 3.5 - 5.1 (mEq/L)    Chloride 105  96 - 112 (mEq/L)    CO2 27  19 - 32 (mEq/L)    Glucose, Bld 105 (*) 70 - 99 (mg/dL)    BUN 17  6 - 23 (mg/dL)    Creatinine, Ser 7.82  0.50 - 1.35 (mg/dL)    Calcium 9.0  8.4 - 10.5 (mg/dL)    Total Protein 7.1  6.0 - 8.3 (g/dL)    Albumin 3.4 (*) 3.5 - 5.2 (g/dL)    AST 41 (*) 0 - 37 (U/L)    ALT 42  0 - 53 (U/L)    Alkaline Phosphatase 62  39 - 117 (U/L)    Total Bilirubin 1.0  0.3 - 1.2 (mg/dL)    GFR calc non Af Amer 62 (*) >90 (mL/min)    GFR calc Af Amer 72 (*) >90 (mL/min)   D-DIMER, QUANTITATIVE     Status: Abnormal   Collection Time   09/26/11  8:45 PM      Component Value Range Comment   D-Dimer, Quant 0.61 (*) 0.00 - 0.48 (ug/mL-FEU)   PRO B NATRIURETIC PEPTIDE     Status: Abnormal   Collection Time   09/26/11  8:46 PM      Component Value Range Comment   Pro B Natriuretic peptide (BNP) 916.0 (*) 0 - 125 (pg/mL)   POCT I-STAT TROPONIN I     Status: Abnormal   Collection Time   09/26/11  9:05 PM      Component Value Range Comment   Troponin i, poc 0.15 (*) 0.00 - 0.08 (ng/mL)    Comment NOTIFIED PHYSICIAN  Comment 3            CARDIAC PANEL(CRET KIN+CKTOT+MB+TROPI)     Status: Abnormal   Collection Time   09/27/11  2:46 AM      Component Value Range Comment   Total CK 1732 (*) 7 - 232 (U/L)    CK, MB 6.2 (*) 0.3 - 4.0 (ng/mL)    Troponin I <0.30  <0.30 (ng/mL)    Relative Index 0.4  0.0 - 2.5     Dg Chest Port 1 View  09/26/2011  *RADIOLOGY REPORT*  Clinical Data: Short of breath.  Cough.  Asthma.  Coronary artery disease.  PORTABLE CHEST - 1 VIEW  Comparison: 08/17/2010  Findings: Cardiomegaly and chronic pulmonary vascular congestion are stable.  No evidence of acute infiltrate or edema.  No evidence of pleural effusion.  No mass or lymphadenopathy  identified.  IMPRESSION: Stable cardiomegaly and pulmonary venous hypertension.  No acute findings.  Original Report Authenticated By: Danae Orleans, M.D.    Review of Systems  HENT: Positive for congestion.   Respiratory: Positive for cough and shortness of breath. Negative for hemoptysis, sputum production and wheezing.   Cardiovascular: Positive for orthopnea, claudication and PND. Negative for chest pain, palpitations and leg swelling.  All other systems reviewed and are negative.    Blood pressure 130/86, pulse 76, temperature 98.1 F (36.7 C), temperature source Oral, resp. rate 22, height 6' (1.829 m), weight 283 lb 15.2 oz (128.8 kg), SpO2 90.00%. Physical Exam  Constitutional: He is oriented to person, place, and time. He appears well-developed and well-nourished.  HENT:  Head: Normocephalic and atraumatic.  Right Ear: External ear normal.  Left Ear: External ear normal.  Nose: Nose normal.  Mouth/Throat: Oropharynx is clear and moist.  Eyes: Conjunctivae and EOM are normal. Pupils are equal, round, and reactive to light.  Neck: Normal range of motion. Neck supple.  Cardiovascular: Normal rate, regular rhythm, normal heart sounds and intact distal pulses.   Respiratory: He is in respiratory distress. He has no wheezes. He has rales. He exhibits no tenderness.  GI: Soft. Bowel sounds are normal.  Musculoskeletal: Normal range of motion.  Neurological: He is alert and oriented to person, place, and time. He has normal reflexes.  Skin: Skin is warm. He is diaphoretic.  Psychiatric: He has a normal mood and affect. His behavior is normal. Judgment and thought content normal.     Assessment/Plan 1. CHF exacerbation: His EF is currently unknown but likely to be Diastolic dysfunction due to his history of sleep apnea and morbid obesity. Will admit, get 2D echo, Diurese, ACEI, BB and consider [facility] consult. 2. HTN: start on cheaper ACEI and Beta Blockers on the $4 formulary 3.  Morbid Obesity: Nutritional/Exercise counseling 4. Hypokalemia: Replete 5. Sleep Apnea: Not on CPAP  Trayvon Trumbull,LAWAL 09/27/2011, 5:14 AM

## 2011-09-28 DIAGNOSIS — Z9119 Patient's noncompliance with other medical treatment and regimen: Secondary | ICD-10-CM

## 2011-09-28 DIAGNOSIS — I5043 Acute on chronic combined systolic (congestive) and diastolic (congestive) heart failure: Secondary | ICD-10-CM | POA: Diagnosis present

## 2011-09-28 DIAGNOSIS — I429 Cardiomyopathy, unspecified: Secondary | ICD-10-CM | POA: Diagnosis present

## 2011-09-28 DIAGNOSIS — Z91199 Patient's noncompliance with other medical treatment and regimen due to unspecified reason: Secondary | ICD-10-CM

## 2011-09-28 DIAGNOSIS — N182 Chronic kidney disease, stage 2 (mild): Secondary | ICD-10-CM | POA: Diagnosis present

## 2011-09-28 LAB — BASIC METABOLIC PANEL
CO2: 30 mEq/L (ref 19–32)
Calcium: 9.1 mg/dL (ref 8.4–10.5)
Creatinine, Ser: 1.36 mg/dL — ABNORMAL HIGH (ref 0.50–1.35)
GFR calc Af Amer: 70 mL/min — ABNORMAL LOW (ref >90)
GFR calc non Af Amer: 60 mL/min — ABNORMAL LOW (ref >90)

## 2011-09-28 MED ORDER — CARVEDILOL 12.5 MG PO TABS
12.5000 mg | ORAL_TABLET | Freq: Two times a day (BID) | ORAL | Status: DC
  Administered 2011-09-28 – 2011-09-29 (×2): 12.5 mg via ORAL
  Filled 2011-09-28 (×4): qty 1

## 2011-09-28 NOTE — Progress Notes (Signed)
Utilization review completed. Joel Silva 09/28/2011 

## 2011-09-28 NOTE — Plan of Care (Signed)
Problem: Phase I Progression Outcomes Goal: EF % per last Echo/documented,Core Reminder form on chart 35-40% (09/28/2011)

## 2011-09-28 NOTE — Plan of Care (Signed)
Problem: Food- and Nutrition-Related Knowledge Deficit (NB-1.1) Goal: Nutrition education Formal process to instruct or train a patient/client in a skill or to impart knowledge to help patients/clients voluntarily manage or modify food choices and eating behavior to maintain or improve health.  Outcome: Completed/Met Date Met:  09/28/11 RD consulted for low sodium diet education. 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 RD talked with patient about sodium recommendations and ways to reduce sodium while eating out RD provided pt with low sodium handout and nutrition information for common fast food places. Pt and wife asked appropriate questions, willing to make changes to decrease sodium intake. Wife agreeable to not using salt when cooking.  Diet: Heart, PO intake 75-100%. Body mass index is 38.63 kg/(m^2). Obesity unspecified. No additional nutrition interventions at this time.   Clarene Duke MARIE

## 2011-09-28 NOTE — Progress Notes (Signed)
Cosign for Joel Tripp RN assessment, med admin, care plan/pathway, I/O, and notes 

## 2011-09-28 NOTE — Progress Notes (Signed)
09/28/2011 [facility], Bosie Clos SPARKS Case Management Note [phone]    CARE MANAGEMENT NOTE 09/28/2011  Patient:  Joel Silva, Joel Silva   Account Number:  [id number]  Date Initiated:  09/28/2011  Documentation initiated by:  Fransico Michael  Subjective/Objective Assessment:   admitted on 09/26/11 with c/o shortness of breath.     Action/Plan:   prior to admission, patient lived at home with family support. Independent with ADLs   Anticipated DC Date:  10/01/2011   Anticipated DC Plan:  HOME W HOME HEALTH SERVICES      DC Planning Services  CM consult      Choice offered to / List presented to:             Status of service:  In process, will continue to follow Medicare Important Message given?   (If response is "NO", the following Medicare IM given date fields will be blank) Date Medicare IM given:   Date Additional Medicare IM given:    Discharge Disposition:    Per UR Regulation:  Reviewed for med. necessity/level of care/duration of stay  If discussed at Long Length of Stay Meetings, dates discussed:    Comments:  PCP: NO,PCP  Contact: HOYTE, ZIEBELL (spouse) [phone]  09/28/11-1614-J.Lutricia Horsfall  [phone]      51yo male patient admitted with CHF on 09/26/11. Prior to admission, patient lived at home with family support. Independent with ADLs. Noted that patient does not have a PCP. In to speak with patient, sleeping. CM will check back with patient tomorrow.

## 2011-09-28 NOTE — Progress Notes (Signed)
  Echocardiogram 2D Echocardiogram has been performed.  Cathie Beams Deneen 09/28/2011, 10:09 AM

## 2011-09-28 NOTE — Progress Notes (Signed)
Brief Nutrition Note:   RD consulted for Low sodium and Low fat diet. RD provided patient with low sodium nutrition education earlier today. RD provided patient with low fat nutrition information and went over recommendations. Patient verbalized understanding of reason why he is on a low fat diet. No additional questions at this time. No other nutrition interventions at this time.   Clarene Duke MARIE

## 2011-09-28 NOTE — Progress Notes (Signed)
Subjective: Discussed findings a 2-D echocardiogram was patient. Patient confirmed understanding. Patient diuresing well and has a negative balance of 2-1/2 L. Encourage patient to get up and ambulating the halls. Objective: Filed Vitals:   09/28/11 0312 09/28/11 0647 09/28/11 1015 09/28/11 1300  BP:  148/88  128/81  Pulse:  72  75  Temp:  97.9 F (36.6 C)  98.5 F (36.9 C)  TempSrc:  Oral  Oral  Resp:  20  20  Height:      Weight:  129.184 kg (284 lb 12.8 oz)    SpO2: 95% 98% 96% 93%   Weight change: 0.384 kg (13.6 oz)  Intake/Output Summary (Last 24 hours) at 09/28/11 1856 Last data filed at 09/28/11 1736  Gross per 24 hour  Intake   1203 ml  Output   1650 ml  Net   -447 ml    General: Alert, awake, oriented x3, in no acute distress.  HEENT: [place]/AT PEERL, EOMI Neck: Trachea midline,  no masses, no thyromegaly. OROPHARYNX:  Moist, No exudate/ erythema/lesions.  Heart: Regular rate and rhythm, without murmurs, rubs, gallops, PMI non-displaced, no heaves or thrills on palpation.  Lungs: Clear to auscultation, no wheezing or rhonchi noted. No increased vocal fremitus resonant to percussion  Abdomen: Soft, nontender, nondistended, positive bowel sounds, no masses no hepatosplenomegaly noted..  Musculoskeletal: No warm swelling or erythema around joints, no spinal tenderness noted.    Lab Results:  [facility] 09/28/11 0625 09/27/11 0535  NA 140 140  K 3.3* 3.4*  CL 102 104  CO2 30 27  GLUCOSE 100* 102*  BUN 17 16  CREATININE 1.36* 1.39*  CALCIUM 9.1 8.9  MG -- 2.2  PHOS -- --    [facility] 09/26/11 2045  AST 41*  ALT 42  ALKPHOS 62  BILITOT 1.0  PROT 7.1  ALBUMIN 3.4*   No results found for this basename: LIPASE:2,AMYLASE:2 in the last 72 hours  Basename 09/27/11 0535 09/26/11 2045  WBC 6.7 7.3  NEUTROABS -- 3.9  HGB 13.6 13.1  HCT 41.6 39.5  MCV 87.4 86.1  PLT 162 147*    Basename 09/27/11 1803 09/27/11 0943 09/27/11 0246  CKTOTAL 1456* 1600* 1732*    CKMB 5.6* 6.1* 6.2*  CKMBINDEX -- -- --  TROPONINI <0.30 <0.30 <0.30   No components found with this basename: POCBNP:3  Basename 09/26/11 2045  DDIMER 0.61*   No results found for this basename: HGBA1C:2 in the last 72 hours No results found for this basename: CHOL:2,HDL:2,LDLCALC:2,TRIG:2,CHOLHDL:2,LDLDIRECT:2 in the last 72 hours  Basename 09/27/11 0535  TSH 1.890  T4TOTAL --  T3FREE --  THYROIDAB --   No results found for this basename: VITAMINB12:2,FOLATE:2,FERRITIN:2,TIBC:2,IRON:2,RETICCTPCT:2 in the last 72 hours  Micro Results: No results found for this or any previous visit (from the past 240 hour(s)).  Studies/Results: Dg Chest Port 1 View  09/26/2011  *RADIOLOGY REPORT*  Clinical Data: Short of breath.  Cough.  Asthma.  Coronary artery disease.  PORTABLE CHEST - 1 VIEW  Comparison: 08/17/2010  Findings: Cardiomegaly and chronic pulmonary vascular congestion are stable.  No evidence of acute infiltrate or edema.  No evidence of pleural effusion.  No mass or lymphadenopathy identified.  IMPRESSION: Stable cardiomegaly and pulmonary venous hypertension.  No acute findings.  Original Report Authenticated By: Danae Orleans, M.D.    Medications: I have reviewed the patient's current medications. Scheduled Meds:    . carvedilol  12.5 mg Oral BID WC  . enoxaparin  40 mg Subcutaneous Daily  .  furosemide  40 mg Intravenous BID  . lisinopril  10 mg Oral Daily  . sodium chloride  3 mL Intravenous Q12H  . DISCONTD: metoprolol tartrate  25 mg Oral BID   Continuous Infusions:    . sodium chloride 50 mL/hr at 09/26/11 2313   PRN Meds:.sodium chloride, acetaminophen, albuterol, ondansetron (ZOFRAN) IV, sodium chloride Assessment/Plan: Patient Active Hospital Problem List: CHF with unknown LVEF (09/26/2011)   Assessment: Patient will continue on IV Lasix. Lisinopril has been added we will monitor his blood pressures and is possibly lisinopril. Appreciate cardiology input.  Coreg 12.5 twice a day has been added to her regimen   HTN (hypertension) (09/26/2011)   Assessment: Blood pressures have been good throughout the day however slowly creeping up this evening particularly the diastolic blood pressure. Continue to monitor and titrate medications.   Sleep apnea (09/26/2011)   Assessment: It is unclear as to whether or not the patient has had a formal studies and CPAP however presently he is not on CPAP. I last overnight titration the patient is hospitalized evaluate whether or not he's having hypoxemia at night.   Hypokalemia (09/26/2011)   Assessment: This is been repleted orally continue to monitor his electrolytes       LOS: 2 days

## 2011-09-28 NOTE — Consult Note (Signed)
Reason for Consult: Cardiomyopathy  Requesting Physician: [facility]  HPI: This is a 51 y.o. male with a past medical history significant for HTN and  NICM. He had a cath 2/12-Nl Cors. His EF then was 30%.  Dr Royann Shivers saw him 3/12 but the pt has not kept subsequent appointments. The pt is admitted now with SOB. He admits to being out of medications for about a year because of the cost. 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 He was admitted with increasing SOB for the past few days. BNP was 900 on admission. Echo show an EF of 35-40%.  PMHx:  Past Medical History  Diagnosis Date  . Hypertension   . CHF (congestive heart failure)   . Sleep apnea    History reviewed. No pertinent past surgical history.  FAMHx: History reviewed. No pertinent family history.  SOCHx:  reports that he has quit smoking. He does not have any smokeless tobacco history on file. His alcohol and drug histories not on file.  ALLERGIES: No Known Allergies  ROS: Pertinent items are noted in HPI.  HOME MEDICATIONS: No prescriptions prior to admission    HOSPITAL MEDICATIONS: I have reviewed the patient's current medications.  VITALS: Blood pressure 128/81, pulse 75, temperature 98.5 F (36.9 C), temperature source Oral, resp. rate 20, height 6' (1.829 m), weight 129.184 kg (284 lb 12.8 oz), SpO2 93.00%.  PHYSICAL EXAM: General appearance: alert, cooperative, no distress and moderately obese Neck: no carotid bruit, no JVD and supple, symmetrical, trachea midline Lungs: clear to auscultation bilaterally Heart: regular rate and rhythm Abdomen: soft, non-tender; bowel sounds normal; no masses,  no organomegaly Extremities: no edema Pulses: 2+ and symmetric Skin: Skin color, texture, turgor normal. No rashes or lesions Neurologic: Grossly normal  LABS: Results for orders placed during the hospital encounter of 09/26/11 (from the past 48 hour(s))  CBC     Status:  Abnormal   Collection Time   09/26/11  8:45 PM      Component Value Range Comment   WBC 7.3  4.0 - 10.5 (K/uL)    RBC 4.59  4.22 - 5.81 (MIL/uL)    Hemoglobin 13.1  13.0 - 17.0 (g/dL)    HCT 16.1  09.6 - 04.5 (%)    MCV 86.1  78.0 - 100.0 (fL)    MCH 28.5  26.0 - 34.0 (pg)    MCHC 33.2  30.0 - 36.0 (g/dL)    RDW 40.9  81.1 - 91.4 (%)    Platelets 147 (*) 150 - 400 (K/uL)   DIFFERENTIAL     Status: Normal   Collection Time   09/26/11  8:45 PM      Component Value Range Comment   Neutrophils Relative 53  43 - 77 (%)    Neutro Abs 3.9  1.7 - 7.7 (K/uL)    Lymphocytes Relative 39  12 - 46 (%)    Lymphs Abs 2.9  0.7 - 4.0 (K/uL)    Monocytes Relative 6  3 - 12 (%)    Monocytes Absolute 0.4  0.1 - 1.0 (K/uL)    Eosinophils Relative 1  0 - 5 (%)    Eosinophils Absolute 0.1  0.0 - 0.7 (K/uL)    Basophils Relative 0  0 - 1 (%)    Basophils Absolute 0.0  0.0 - 0.1 (K/uL)   COMPREHENSIVE METABOLIC PANEL     Status: Abnormal   Collection Time   09/26/11  8:45 PM  Component Value Range Comment   Sodium 141  135 - 145 (mEq/L)    Potassium 3.3 (*) 3.5 - 5.1 (mEq/L)    Chloride 105  96 - 112 (mEq/L)    CO2 27  19 - 32 (mEq/L)    Glucose, Bld 105 (*) 70 - 99 (mg/dL)    BUN 17  6 - 23 (mg/dL)    Creatinine, Ser 5.62  0.50 - 1.35 (mg/dL)    Calcium 9.0  8.4 - 10.5 (mg/dL)    Total Protein 7.1  6.0 - 8.3 (g/dL)    Albumin 3.4 (*) 3.5 - 5.2 (g/dL)    AST 41 (*) 0 - 37 (U/L)    ALT 42  0 - 53 (U/L)    Alkaline Phosphatase 62  39 - 117 (U/L)    Total Bilirubin 1.0  0.3 - 1.2 (mg/dL)    GFR calc non Af Amer 62 (*) >90 (mL/min)    GFR calc Af Amer 72 (*) >90 (mL/min)   D-DIMER, QUANTITATIVE     Status: Abnormal   Collection Time   09/26/11  8:45 PM      Component Value Range Comment   D-Dimer, Quant 0.61 (*) 0.00 - 0.48 (ug/mL-FEU)   PRO B NATRIURETIC PEPTIDE     Status: Abnormal   Collection Time   09/26/11  8:46 PM      Component Value Range Comment   Pro B Natriuretic peptide (BNP)  916.0 (*) 0 - 125 (pg/mL)   POCT I-STAT TROPONIN I     Status: Abnormal   Collection Time   09/26/11  9:05 PM      Component Value Range Comment   Troponin i, poc 0.15 (*) 0.00 - 0.08 (ng/mL)    Comment NOTIFIED PHYSICIAN      Comment 3            CARDIAC PANEL(CRET KIN+CKTOT+MB+TROPI)     Status: Abnormal   Collection Time   09/27/11  2:46 AM      Component Value Range Comment   Total CK 1732 (*) 7 - 232 (U/L)    CK, MB 6.2 (*) 0.3 - 4.0 (ng/mL)    Troponin I <0.30  <0.30 (ng/mL)    Relative Index 0.4  0.0 - 2.5    BASIC METABOLIC PANEL     Status: Abnormal   Collection Time   09/27/11  5:35 AM      Component Value Range Comment   Sodium 140  135 - 145 (mEq/L)    Potassium 3.4 (*) 3.5 - 5.1 (mEq/L)    Chloride 104  96 - 112 (mEq/L)    CO2 27  19 - 32 (mEq/L)    Glucose, Bld 102 (*) 70 - 99 (mg/dL)    BUN 16  6 - 23 (mg/dL)    Creatinine, Ser 1.30 (*) 0.50 - 1.35 (mg/dL)    Calcium 8.9  8.4 - 10.5 (mg/dL)    GFR calc non Af Amer 59 (*) >90 (mL/min)    GFR calc Af Amer 68 (*) >90 (mL/min)   TSH     Status: Normal   Collection Time   09/27/11  5:35 AM      Component Value Range Comment   TSH 1.890  0.350 - 4.500 (uIU/mL)   MAGNESIUM     Status: Normal   Collection Time   09/27/11  5:35 AM      Component Value Range Comment   Magnesium 2.2  1.5 - 2.5 (mg/dL)  CBC     Status: Normal   Collection Time   09/27/11  5:35 AM      Component Value Range Comment   WBC 6.7  4.0 - 10.5 (K/uL)    RBC 4.76  4.22 - 5.81 (MIL/uL)    Hemoglobin 13.6  13.0 - 17.0 (g/dL)    HCT 78.2  95.6 - 21.3 (%)    MCV 87.4  78.0 - 100.0 (fL)    MCH 28.6  26.0 - 34.0 (pg)    MCHC 32.7  30.0 - 36.0 (g/dL)    RDW 08.6  57.8 - 46.9 (%)    Platelets 162  150 - 400 (K/uL)   CARDIAC PANEL(CRET KIN+CKTOT+MB+TROPI)     Status: Abnormal   Collection Time   09/27/11  9:43 AM      Component Value Range Comment   Total CK 1600 (*) 7 - 232 (U/L)    CK, MB 6.1 (*) 0.3 - 4.0 (ng/mL) CRITICAL VALUE NOTED.  VALUE  IS CONSISTENT WITH PREVIOUSLY REPORTED AND CALLED VALUE.   Troponin I <0.30  <0.30 (ng/mL)    Relative Index 0.4  0.0 - 2.5    CARDIAC PANEL(CRET KIN+CKTOT+MB+TROPI)     Status: Abnormal   Collection Time   09/27/11  6:03 PM      Component Value Range Comment   Total CK 1456 (*) 7 - 232 (U/L)    CK, MB 5.6 (*) 0.3 - 4.0 (ng/mL)    Troponin I <0.30  <0.30 (ng/mL)    Relative Index 0.4  0.0 - 2.5    BASIC METABOLIC PANEL     Status: Abnormal   Collection Time   09/28/11  6:25 AM      Component Value Range Comment   Sodium 140  135 - 145 (mEq/L)    Potassium 3.3 (*) 3.5 - 5.1 (mEq/L)    Chloride 102  96 - 112 (mEq/L)    CO2 30  19 - 32 (mEq/L)    Glucose, Bld 100 (*) 70 - 99 (mg/dL)    BUN 17  6 - 23 (mg/dL)    Creatinine, Ser 6.29 (*) 0.50 - 1.35 (mg/dL)    Calcium 9.1  8.4 - 10.5 (mg/dL)    GFR calc non Af Amer 60 (*) >90 (mL/min)    GFR calc Af Amer 70 (*) >90 (mL/min)     IMAGING: Dg Chest Port 1 View  09/26/2011  *RADIOLOGY REPORT*  Clinical Data: Short of breath.  Cough.  Asthma.  Coronary artery disease.  PORTABLE CHEST - 1 VIEW  Comparison: 08/17/2010  Findings: Cardiomegaly and chronic pulmonary vascular congestion are stable.  No evidence of acute infiltrate or edema.  No evidence of pleural effusion.  No mass or lymphadenopathy identified.  IMPRESSION: Stable cardiomegaly and pulmonary venous hypertension.  No acute findings.  Original Report Authenticated By: Danae Orleans, M.D.    IMPRESSION: Principal Problem:  *Acute on chronic combined systolic and diastolic congestive heart failure Active Problems:  HTN (hypertension)  Noncompliance, no meds X 1 year secondary to cost  Chronic renal insufficiency, stage II (mild)  NICM, cath 2/12 Nl cors, EF 30%  Morbid obesity  Sleep apnea, on c-pap   RECOMMENDATION: Consider changing Lopressor to Coreg, increase ACE as tolerated, resume home c-pap, he is on IV Lasix. We will follow up as an OP after discharge.  Time Spent  Directly with Patient:  45 minutes  KILROY,LUKE K 09/28/2011, 3:22 PM   Agree with note written by  Corine Shelter [facility]  I saw pt yesterday and agree with rec by [facility]K PAC. H/O NISCM. Admitted with CHF. Hadn't been taking meds recently. 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 Exam was benign. Can probably go home with close OP f/u  LEWI, DROST 09/29/2011 10:54 AM

## 2011-09-29 ENCOUNTER — Encounter: Payer: Self-pay | Admitting: Internal Medicine

## 2011-09-29 LAB — BASIC METABOLIC PANEL
Calcium: 9.5 mg/dL (ref 8.4–10.5)
GFR calc Af Amer: 69 mL/min — ABNORMAL LOW (ref >90)
GFR calc non Af Amer: 60 mL/min — ABNORMAL LOW (ref >90)
Glucose, Bld: 91 mg/dL (ref 70–99)
Sodium: 140 mEq/L (ref 135–145)

## 2011-09-29 MED ORDER — CARVEDILOL 12.5 MG PO TABS: 12.5000 mg | ORAL_TABLET | Freq: Two times a day (BID) | ORAL | Status: DC

## 2011-09-29 MED ORDER — FUROSEMIDE 40 MG PO TABS: 40.0000 mg | ORAL_TABLET | Freq: Every day | ORAL | Status: DC

## 2011-09-29 MED ORDER — LISINOPRIL 10 MG PO TABS: 10.0000 mg | ORAL_TABLET | Freq: Every day | ORAL | Status: DC

## 2011-09-29 NOTE — Discharge Summary (Signed)
Joel Silva MRN: [id number] DOB/AGE: 08/10/1960 51 y.o.  Admit date: 09/26/2011 Discharge date: 09/29/2011  Primary Care Physician:  No primary provider on file.   Discharge Diagnoses:   Patient Active Problem List  Diagnoses  . HTN (hypertension)  . Morbid obesity  . Sleep apnea, on c-pap  . Noncompliance, no meds X 1 year secondary to cost  . Chronic renal insufficiency, stage II (mild)  . Acute on chronic combined systolic and diastolic congestive heart failure  . NICM, cath 2/12 Nl cors, EF 30%    DISCHARGE MEDICATION: Medication List  As of 09/29/2011  7:19 PM   TAKE these medications         carvedilol 12.5 MG tablet   Commonly known as: COREG   Take 1 tablet (12.5 mg total) by mouth 2 (two) times daily with a meal.      furosemide 40 MG tablet   Commonly known as: LASIX   Take 1 tablet (40 mg total) by mouth daily.      lisinopril 10 MG tablet   Commonly known as: PRINIVIL,ZESTRIL   Take 1 tablet (10 mg total) by mouth daily.              Consults: Treatment Team:  Runell Gess, MD   SIGNIFICANT DIAGNOSTIC STUDIES:  Dg Chest Port 1 View  09/26/2011  *RADIOLOGY REPORT*  Clinical Data: Short of breath.  Cough.  Asthma.  Coronary artery disease.  PORTABLE CHEST - 1 VIEW  Comparison: 08/17/2010  Findings: Cardiomegaly and chronic pulmonary vascular congestion are stable.  No evidence of acute infiltrate or edema.  No evidence of pleural effusion.  No mass or lymphadenopathy identified.  IMPRESSION: Stable cardiomegaly and pulmonary venous hypertension.  No acute findings.  Original Report Authenticated By: Danae Orleans, M.D.     ECHO:   - Left ventricle: The cavity size was normal. Systolic function was moderately reduced. The estimated ejection fraction was in the range of 35% to 40%. Diffuse hypokinesis. Doppler parameters are consistent with abnormal left ventricular relaxation (grade 1 diastolic dysfunction). - Aortic valve: Trivial  regurgitation. - Mitral valve: Moderate regurgitation. - Left atrium: The atrium was moderately dilated     No results found for this or any previous visit (from the past 240 hour(s)).  BRIEF ADMITTING H & P: 51 yo man with history of HTN and CHF who has not been taking his medications for 1 year due to cost here with progressive shortness of breath and weakness for 3 days. Has been seeing Dr Concepcion Elk as well as [facility] in the past. He denies pedal swelling but has recent cold followed by his SOB. No NVD, no abdominal pain. Findings in the Er suggest CHF exacerbation again.    Hospital Course:  Present on Admission:  .CHF, new LVD, EF 35-40% (Nl LVF 6/11): Patient was admitted with clinical signs of decompensated congestive heart failure. Ejection fraction on admission was unknown. Subsequent echocardiogram showed ejection fraction of 35-40%. Patient hasn't started back on lisinopril. He's also been given Lasix 40 mg daily. Patient was counseled for nutrition standpoint. He also was counseled against the importance of decreasing sodium intake. The patient will [facility] for an appointment on 10/08/2011.  Marland KitchenHTN (hypertension): On arrival to the hospital the patient's blood pressures were uncontrolled. The patient was started on lisinopril and carvedilol and blood pressures have been in the range of 114-142/69-96.   .Sleep apnea, on c-pap: The patient has obstructive sleep apnea  but has not been compliant with his use of CPAP at home. During the hospitalization he has been on CPAP. Patient has been advised to followup with the DME vendor for his machine and obtain a mask that fits appropriately.   .Hypokalemia: This was repleted orally.   .Chronic renal insufficiency, stage II (mild): The patient does have chronic kidney disease stage III with a GFR 60 9P   .Acute on chronic combined systolic and diastolic congestive heart failure: See above   Disposition  and Follow-up:   Patient is being discharged home. He's to followup with Dr. Allyson Sabal and colleagues in his office on 10/08/2011. Discharge Orders    Future Orders Please Complete By Expires   Diet - low sodium heart healthy      Activity as tolerated - No restrictions         DISCHARGE EXAM:  General: Alert, awake, oriented x3, in no acute distress.  Vital Signs: Blood pressure 134/91, pulse 71, temperature 98.1 F (36.7 C), temperature source Oral, resp. rate 20, height 6' (1.829 m), weight 129.184 kg (284 lb 12.8 oz), SpO2 100.00%. HEENT: [place]/AT PEERL, EOMI  Neck: Trachea midline, no masses, no thyromegaly.  OROPHARYNX: Moist, No exudate/ erythema/lesions.  Heart: Regular rate and rhythm, without murmurs, rubs, gallops, PMI non-displaced, no heaves or thrills on palpation.  Lungs: Clear to auscultation, no wheezing or rhonchi noted. No increased vocal fremitus resonant to percussion  Abdomen: Soft, nontender, nondistended, positive bowel sounds, no masses no hepatosplenomegaly noted..  Musculoskeletal: No warm swelling or erythema around joints, no spinal tenderness noted.    Basename 09/29/11 0516 09/28/11 0625 09/27/11 0535  NA 140 140 --  K 3.5 3.3* --  CL 102 102 --  CO2 29 30 --  GLUCOSE 91 100* --  BUN 21 17 --  CREATININE 1.37* 1.36* --  CALCIUM 9.5 9.1 --  MG -- -- 2.2  PHOS -- -- --    [facility] 09/26/11 2045  AST 41*  ALT 42  ALKPHOS 62  BILITOT 1.0  PROT 7.1  ALBUMIN 3.4*   No results found for this basename: LIPASE:2,AMYLASE:2 in the last 72 hours  Basename 09/27/11 0535 09/26/11 2045  WBC 6.7 7.3  NEUTROABS -- 3.9  HGB 13.6 13.1  HCT 41.6 39.5  MCV 87.4 86.1  PLT 162 147*   Total time for discharge process including face-to-face time 35 minutes approximate. Signed: Amrutha Avera A. 09/29/2011, 7:19 PM

## 2011-09-29 NOTE — Progress Notes (Signed)
This is a 51 y.o. male with a past medical history significant for HTN and NICM. He had a cath 2/12-Nl Cors. His EF then was 30%. Dr Royann Shivers saw him 3/12 but the pt has not kept subsequent appointments. The pt is admitted now with SOB. He admits to being out of medications for about a year because of the cost. 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 He was admitted with increasing SOB for the past few days. BNP was 900 on admission. Echo show an EF of 35-40%.    Subjective: No complaints, breathing better, still not back to baseline but much improved.  Objective: Vital signs in last 24 hours: Temp:  [98.2 F (36.8 C)-98.5 F (36.9 C)] 98.4 F (36.9 C) (03/26 0800) Pulse Rate:  [65-75] 75  (03/26 0800) Resp:  [20] 20  (03/26 0800) BP: (114-142)/(69-96) 119/69 mmHg (03/26 0955) SpO2:  [93 %-97 %] 97 % (03/26 0800) Weight:  [128.685 kg (283 lb 11.2 oz)-129.184 kg (284 lb 12.8 oz)] 129.184 kg (284 lb 12.8 oz) (03/26 0441) Weight change: -0.499 kg (-1 lb 1.6 oz) Last BM Date: 09/28/11 Intake/Output from previous day: -169  And has diuresed  Though weight has continued to increased 03/25 0701 - 03/26 0700 In: 1131 [P.O.:1080; I.V.:3; IV Piggyback:8] Out: 1300 [Urine:1300] Intake/Output this shift: Total I/O In: 243 [P.O.:240; I.V.:3] Out: 625 [Urine:625]  PE: General:A&O, MAE, oriented X3, pleasant affect. Neck:no JVD in upright position Heart:S1S2 RRR Lungs:diminished breath sounds through out. Abd:+ BS, soft, non tender Ext:tr. Edema only at ankles.     Lab Results:  Basename 09/27/11 0535 09/26/11 2045  WBC 6.7 7.3  HGB 13.6 13.1  HCT 41.6 39.5  PLT 162 147*   BMET  Basename 09/29/11 0516 09/28/11 0625  NA 140 140  K 3.5 3.3*  CL 102 102  CO2 29 30  GLUCOSE 91 100*  BUN 21 17  CREATININE 1.37* 1.36*  CALCIUM 9.5 9.1    Basename 09/27/11 1803 09/27/11 0943  TROPONINI <0.30 <0.30    Lab Results  Component Value Date   CHOL  Value:  165        ATP III CLASSIFICATION:  <200     mg/dL   Desirable  161-096  mg/dL   Borderline High  >=045    mg/dL   High        10/12/8117   HDL 37* 08/18/2010   LDLCALC  Value: 111        Total Cholesterol/HDL:CHD Risk Coronary Heart Disease Risk Table                     Men   Women  1/2 Average Risk   3.4   3.3  Average Risk       5.0   4.4  2 X Average Risk   9.6   7.1  3 X Average Risk  23.4   11.0        Use the calculated Patient Ratio above and the CHD Risk Table to determine the patient's CHD Risk.        ATP III CLASSIFICATION (LDL):  <100     mg/dL   Optimal  147-829  mg/dL   Near or Above                    Optimal  130-159  mg/dL   Borderline  562-130  mg/dL   High  >865  mg/dL   Very High* 1/61/0960   TRIG 85 08/18/2010   CHOLHDL 4.5 08/18/2010   No results found for this basename: HGBA1C     Lab Results  Component Value Date   TSH 1.890 09/27/2011    Hepatic Function Panel  Basename 09/26/11 2045  PROT 7.1  ALBUMIN 3.4*  AST 41*  ALT 42  ALKPHOS 62  BILITOT 1.0  BILIDIR --  IBILI --   No results found for this basename: CHOL in the last 72 hours No results found for this basename: PROTIME in the last 72 hours    EKG: Orders placed during the hospital encounter of 09/26/11  . ED EKG  . ED EKG    Studies/Results: No results found.  Medications: I have reviewed the patient's current medications.    . carvedilol  12.5 mg Oral BID WC  . enoxaparin  40 mg Subcutaneous Daily  . furosemide  40 mg Intravenous BID  . lisinopril  10 mg Oral Daily  . sodium chloride  3 mL Intravenous Q12H  . DISCONTD: metoprolol tartrate  25 mg Oral BID  Assessment/Plan: Patient Active Problem List  Diagnoses  . HTN (hypertension)  . Morbid obesity  . Sleep apnea, on c-pap  . Noncompliance, no meds X 1 year secondary to cost  . Chronic renal insufficiency, stage II (mild)  . Acute on chronic combined systolic and diastolic congestive heart failure  . NICM, cath 2/12 Nl cors, EF  30%   PLAN: CK elevated neg. Relative index, negative troponin I Pro BNP 916 on admit. Mild hypokalemia  2D Echo: ------------------------------------------------------------ Study Conclusions  - Left ventricle: The cavity size was normal. Systolic function was moderately reduced. The estimated ejection fraction was in the range of 35% to 40%. Diffuse hypokinesis. Doppler parameters are consistent with abnormal left ventricular relaxation (grade 1 diastolic dysfunction). - Aortic valve: Trivial regurgitation. - Mitral valve: Moderate regurgitation. 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     LOS: 3 days   INGOLD,LAURA R 09/29/2011, 10:25 AM   Agree with note written by Nada Boozer RNP  Feeling better. Meds adjusted. BNP was only 916. Exam benign. OK for D/C home. Needs CPAP masks. Will arrange OP follow up.  LIONAL, ICENOGLE 09/29/2011 10:59 AM

## 2011-09-29 NOTE — Progress Notes (Signed)
Pt was off teley for a period of 25 minutes due to malfunction of the teley box. During the time the patient was off the monitor, he showed no signs of distress.  Teley box and leads were changed until it was noted that the leads were not functioning properly.  Leads were replaced and teley box began to work properly.  Patient is resting comfortably.

## 2011-09-29 NOTE — Progress Notes (Signed)
Placed pt. On CPAP via nasal mask, auto-titrate setting, with 2 lpm O2 bleed in. Pt. Tolerating well at this time.

## 2012-02-25 IMAGING — CR DG CHEST 2V
2 series · 2 of 2 positions shown · non-contrast
Comparison: Chest radiograph 08/14/2010

CLINICAL DATA: Shortness of breath

CHEST - 2 VIEW

[w chest pa]
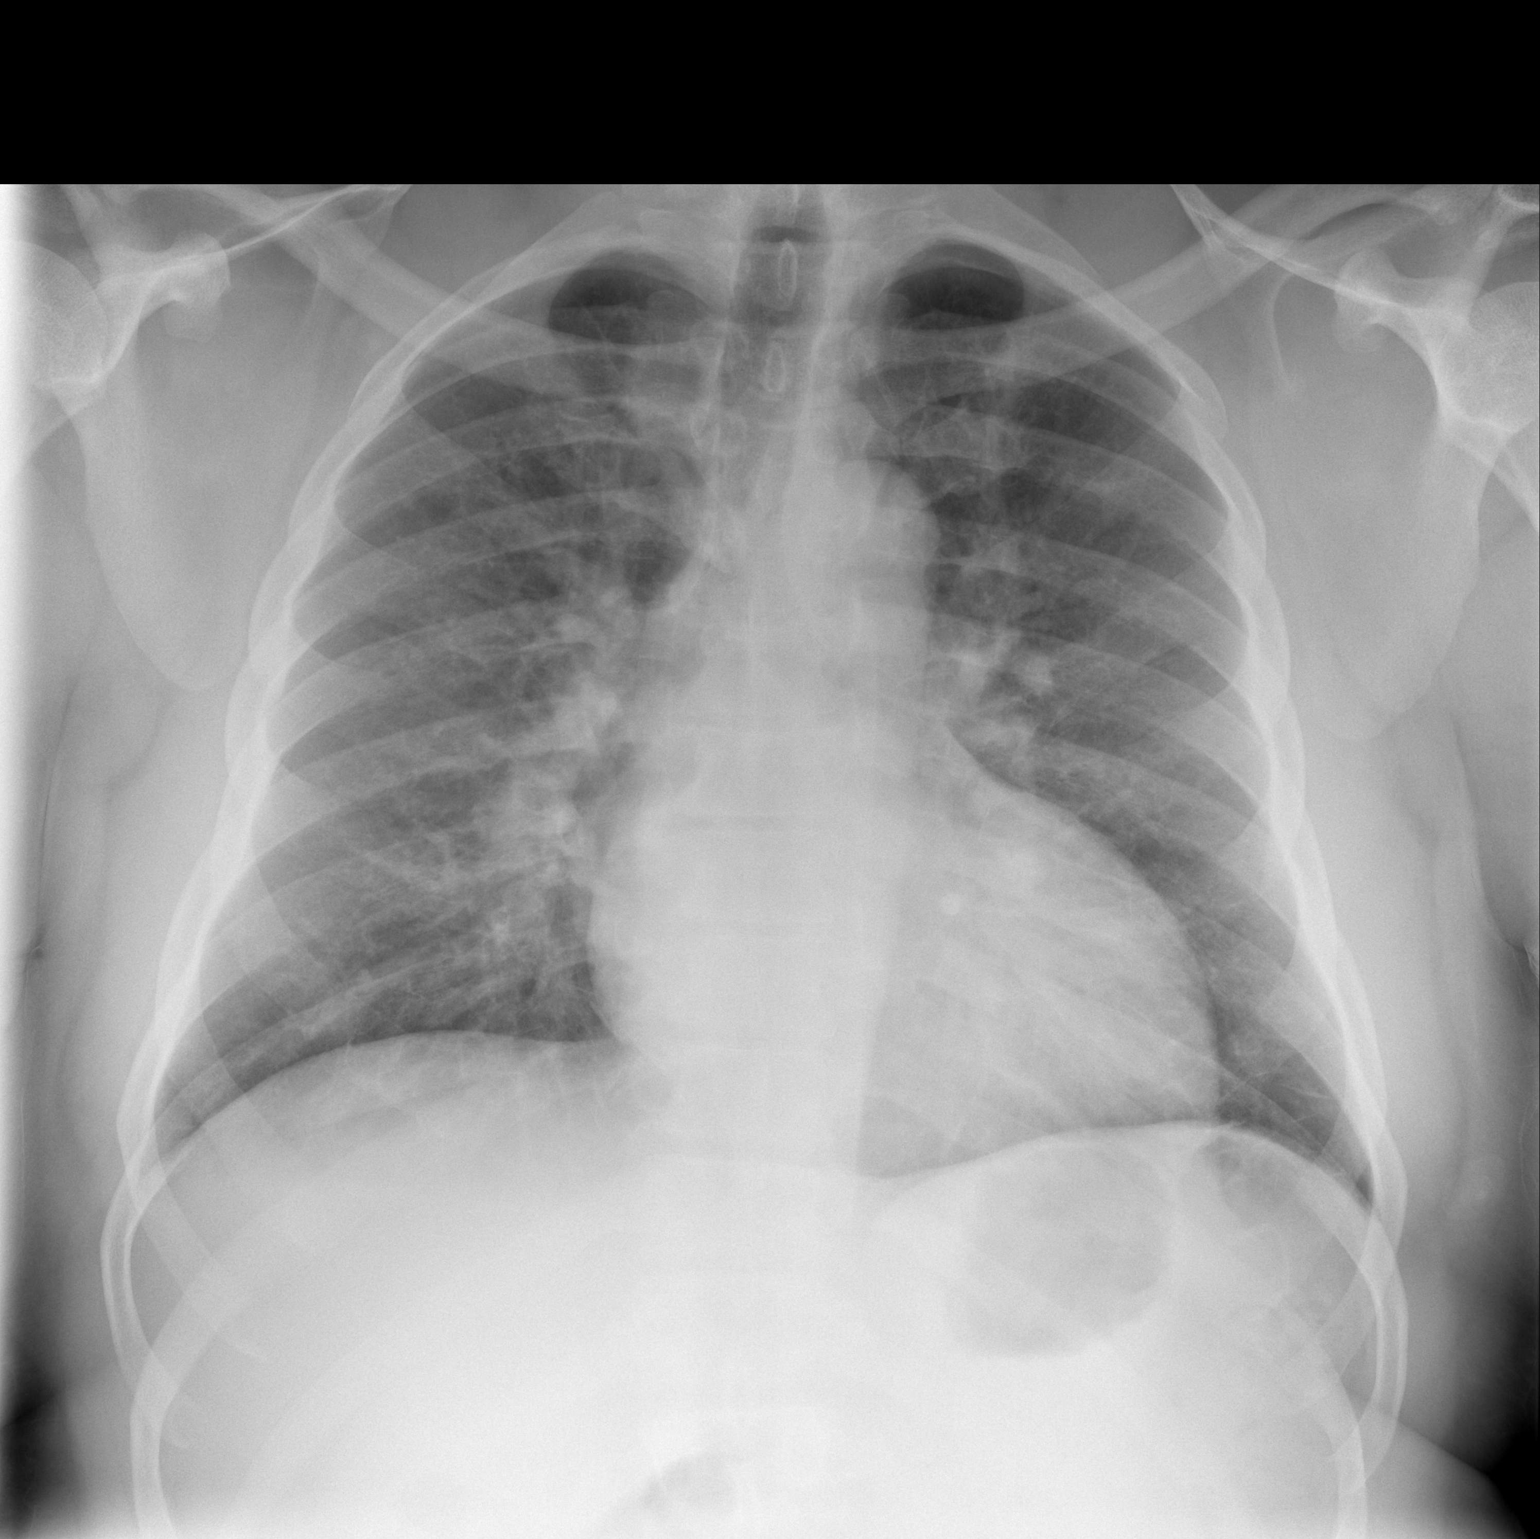

[w chest lat]
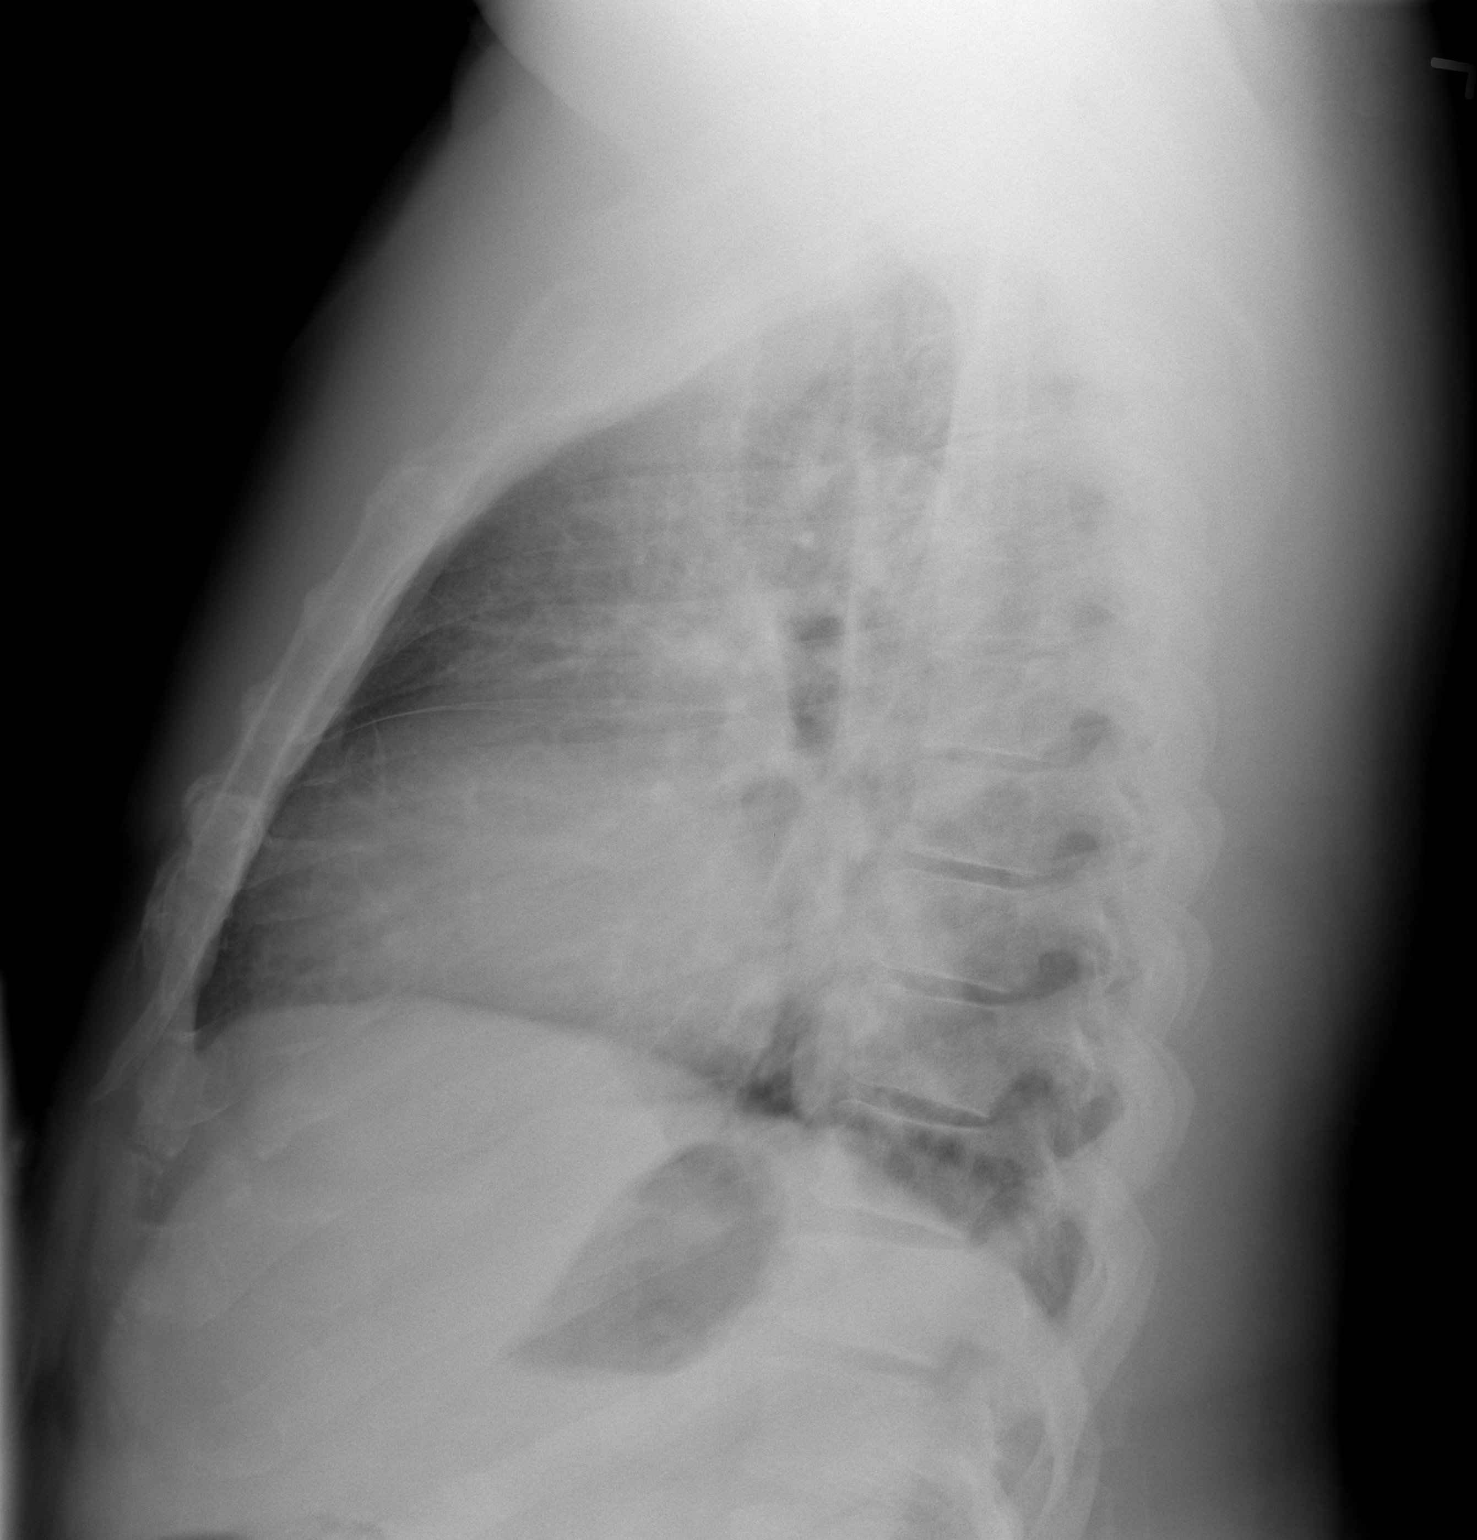

[2 of 2 positions shown; findings below may reference images not displayed]

FINDINGS: Stable cardiomegaly.  There is pulmonary vascular
congestion and diffuse interstitial prominence.  Negative for
pleural effusion.  Bones are unremarkable.
IMPRESSION: Cardiomegaly and pulmonary vascular congestion.

Diffuse mild interstitial prominence may reflect mild interstitial
edema.

## 2013-04-15 ENCOUNTER — Emergency Department (HOSPITAL_BASED_OUTPATIENT_CLINIC_OR_DEPARTMENT_OTHER): Payer: BC Managed Care – PPO

## 2013-04-15 ENCOUNTER — Emergency Department (HOSPITAL_BASED_OUTPATIENT_CLINIC_OR_DEPARTMENT_OTHER)
Admission: EM | Admit: 2013-04-15 | Discharge: 2013-04-15 | Disposition: A | Discharge status: Home / Self Care | Payer: BC Managed Care – PPO | Attending: Emergency Medicine | Admitting: Emergency Medicine

## 2013-04-15 ENCOUNTER — Encounter (HOSPITAL_BASED_OUTPATIENT_CLINIC_OR_DEPARTMENT_OTHER): Payer: Self-pay | Admitting: Emergency Medicine

## 2013-04-15 DIAGNOSIS — R0989 Other specified symptoms and signs involving the circulatory and respiratory systems: Secondary | ICD-10-CM | POA: Insufficient documentation

## 2013-04-15 DIAGNOSIS — I2510 Atherosclerotic heart disease of native coronary artery without angina pectoris: Secondary | ICD-10-CM | POA: Insufficient documentation

## 2013-04-15 DIAGNOSIS — I509 Heart failure, unspecified: Secondary | ICD-10-CM | POA: Insufficient documentation

## 2013-04-15 DIAGNOSIS — Z9861 Coronary angioplasty status: Secondary | ICD-10-CM | POA: Insufficient documentation

## 2013-04-15 DIAGNOSIS — Z79899 Other long term (current) drug therapy: Secondary | ICD-10-CM | POA: Insufficient documentation

## 2013-04-15 DIAGNOSIS — J45909 Unspecified asthma, uncomplicated: Secondary | ICD-10-CM | POA: Insufficient documentation

## 2013-04-15 DIAGNOSIS — R0609 Other forms of dyspnea: Secondary | ICD-10-CM | POA: Insufficient documentation

## 2013-04-15 DIAGNOSIS — Z87891 Personal history of nicotine dependence: Secondary | ICD-10-CM | POA: Insufficient documentation

## 2013-04-15 DIAGNOSIS — R0600 Dyspnea, unspecified: Secondary | ICD-10-CM

## 2013-04-15 DIAGNOSIS — I10 Essential (primary) hypertension: Secondary | ICD-10-CM | POA: Insufficient documentation

## 2013-04-15 DIAGNOSIS — R079 Chest pain, unspecified: Secondary | ICD-10-CM | POA: Insufficient documentation

## 2013-04-15 HISTORY — DX: Atherosclerotic heart disease of native coronary artery without angina pectoris: I25.10

## 2013-04-15 HISTORY — DX: Unspecified asthma, uncomplicated: J45.909

## 2013-04-15 LAB — CBC WITH DIFFERENTIAL/PLATELET
Basophils Relative: 0 % (ref 0–1)
Eosinophils Absolute: 0.1 10*3/uL (ref 0.0–0.7)
Lymphs Abs: 2.6 10*3/uL (ref 0.7–4.0)
MCH: 28.8 pg (ref 26.0–34.0)
Neutro Abs: 4.3 10*3/uL (ref 1.7–7.7)
Neutrophils Relative %: 56 % (ref 43–77)
Platelets: 160 10*3/uL (ref 150–400)
RBC: 4.93 MIL/uL (ref 4.22–5.81)

## 2013-04-15 LAB — BASIC METABOLIC PANEL
GFR calc Af Amer: 52 mL/min — ABNORMAL LOW (ref >90)
GFR calc non Af Amer: 45 mL/min — ABNORMAL LOW (ref >90)
Glucose, Bld: 110 mg/dL — ABNORMAL HIGH (ref 70–99)
Potassium: 4.1 mEq/L (ref 3.5–5.1)
Sodium: 143 mEq/L (ref 135–145)

## 2013-04-15 MED ORDER — FUROSEMIDE 40 MG PO TABS
40.0000 mg | ORAL_TABLET | Freq: Once | ORAL | Status: AC
  Administered 2013-04-15: 40 mg via ORAL
  Filled 2013-04-15: qty 1

## 2013-04-15 NOTE — ED Notes (Signed)
Reports sob x 1.5 months. Was seen by "company doctor" and given inhaler. Pt reports SOB at night. Hx of sleep apnea, using cpap

## 2013-04-15 NOTE — ED Provider Notes (Signed)
CSN: [id number]     Arrival date & time 04/15/13  1645 History  This chart was scribed for non-physician practitioner working with Geoffery Lyons, MD by Ronal Fear, ED scribe. This patient was seen in room MH08/MH08 and the patient's care was started at 6:50 PM.    Chief Complaint  Patient presents with  . Shortness of Breath    Patient is a 52 y.o. male presenting with shortness of breath. The history is provided by the patient. No language interpreter was used.  Shortness of Breath Severity:  Mild Onset quality:  Gradual Duration:  2 months Timing:  Constant Progression:  Unchanged Chronicity:  Recurrent Relieved by:  Nothing Worsened by:  Nothing tried Ineffective treatments:  Inhaler Associated symptoms: chest pain   Associated symptoms: no abdominal pain, no cough, no diaphoresis, no fever, no headaches, no vomiting and no wheezing     Pt has been experiencing SOB for over a month. He was given an inhaler by a company doctor.  He has a hx of high BP and fluid build up. Pt has a hx of sleep apnea. He has mild intermittent chest pain No diabetes, non smoker, no family hx of heart problems.  Pt Has had a stint placed   Past Medical History  Diagnosis Date  . Hypertension   . CHF (congestive heart failure)   . Sleep apnea   . Sleep apnea   . Coronary artery disease   . Asthma    Past Surgical History  Procedure Laterality Date  . Coronary angioplasty with stent placement    . Appendectomy     No family history on file. History  Substance Use Topics  . Smoking status: Former Smoker -- 2 years  . Smokeless tobacco: Never Used  . Alcohol Use: 0.6 oz/week    1 Cans of beer per week    Review of Systems  Constitutional: Negative for fever and diaphoresis.  Respiratory: Positive for shortness of breath. Negative for cough and wheezing.   Cardiovascular: Positive for chest pain.  Gastrointestinal: Negative for vomiting and abdominal pain.  Neurological: Negative for  headaches.  All other systems reviewed and are negative.    Allergies  Review of patient's allergies indicates no known allergies.  Home Medications   Current Outpatient Rx  Name  Route  Sig  Dispense  Refill  . albuterol (PROVENTIL HFA;VENTOLIN HFA) 108 (90 BASE) MCG/ACT inhaler   Inhalation   Inhale 2 puffs into the lungs every 6 (six) hours as needed for wheezing.         . carvedilol (COREG) 12.5 MG tablet   Oral   Take 12.5 mg by mouth 2 (two) times daily with a meal.         . furosemide (LASIX) 40 MG tablet   Oral   Take 40 mg by mouth.         Marland Kitchen lisinopril (PRINIVIL,ZESTRIL) 40 MG tablet   Oral   Take 40 mg by mouth daily.         Marland Kitchen spironolactone (ALDACTONE) 25 MG tablet   Oral   Take 25 mg by mouth daily.         Marland Kitchen EXPIRED: carvedilol (COREG) 12.5 MG tablet   Oral   Take 1 tablet (12.5 mg total) by mouth 2 (two) times daily with a meal.   30 tablet   0   . EXPIRED: furosemide (LASIX) 40 MG tablet   Oral   Take 1 tablet (40 mg total) by  mouth daily.   30 tablet   0   . EXPIRED: lisinopril (PRINIVIL,ZESTRIL) 10 MG tablet   Oral   Take 1 tablet (10 mg total) by mouth daily.   30 tablet   0    BP 172/91  Pulse 83  Temp(Src) 98.9 F (37.2 C) (Oral)  Resp 18  Ht 5\' 11"  (1.803 m)  Wt 300 lb (136.079 kg)  BMI 41.86 kg/m2  SpO2 98% Physical Exam  Nursing note and vitals reviewed. Constitutional: He is oriented to person, place, and time. He appears well-developed and well-nourished. No distress.  HENT:  Head: Normocephalic and atraumatic.  Eyes: EOM are normal. Pupils are equal, round, and reactive to light.  Neck: Neck supple. No tracheal deviation present.  Cardiovascular: Normal rate.   Pulmonary/Chest: He is in respiratory distress.  Abdominal: Soft.  Musculoskeletal: Normal range of motion.  Neurological: He is alert and oriented to person, place, and time.  Skin: Skin is warm and dry.  Psychiatric: He has a normal mood and  affect. His behavior is normal.    ED Course  Procedures (including critical care time)  DIAGNOSTIC STUDIES: Oxygen Saturation is 98% on RA, normal by my interpretation.    COORDINATION OF CARE: 6:53 PM- Pt advised of plan for treatment including checking his heart, lungs, and doing some blood work and pt agrees.      Labs Review Labs Reviewed - No data to display Imaging Review No results found.  EKG Interpretation   None      Date: 04/15/2013  Rate: 84  Rhythm: normal sinus rhythm  QRS Axis: normal  Intervals: normal  ST/T Wave abnormalities: nonspecific ST/T changes  Conduction Disutrbances:none  Narrative Interpretation:   Old EKG Reviewed: none available Pt counseled on results.  Pt given lasix po.   I advised pt he needs to recheck with Dr. Henrietta Hoover   MDM   1. Dyspnea     I personally performed the services in this documentation, which was scribed in my presence.  The recorded information has been reviewed and considered.   Barnet Pall.  Lonia Skinner [place], PA-C 04/15/13 2319

## 2013-04-15 NOTE — ED Provider Notes (Signed)
Medical screening examination/treatment/procedure(s) were performed by non-physician practitioner and as supervising physician I was immediately available for consultation/collaboration.  Lavance Beazer, MD 04/15/13 2337 

## 2013-04-17 ENCOUNTER — Other Ambulatory Visit: Payer: Self-pay | Admitting: *Deleted

## 2013-04-17 MED ORDER — LISINOPRIL 40 MG PO TABS: ORAL_TABLET | ORAL | Status: DC

## 2013-04-17 MED ORDER — SPIRONOLACTONE 25 MG PO TABS: ORAL_TABLET | ORAL | Status: AC

## 2013-04-17 NOTE — Telephone Encounter (Signed)
Rx was sent to pharmacy electronically. 

## 2014-06-05 ENCOUNTER — Inpatient Hospital Stay (HOSPITAL_BASED_OUTPATIENT_CLINIC_OR_DEPARTMENT_OTHER)
Admission: EM | Admit: 2014-06-05 | Discharge: 2014-06-09 | DRG: 638 | Disposition: A | Discharge status: Home / Self Care | Payer: Medicaid Other | Attending: Internal Medicine | Admitting: Internal Medicine

## 2014-06-05 ENCOUNTER — Encounter (HOSPITAL_BASED_OUTPATIENT_CLINIC_OR_DEPARTMENT_OTHER): Payer: Self-pay | Admitting: *Deleted

## 2014-06-05 DIAGNOSIS — N179 Acute kidney failure, unspecified: Secondary | ICD-10-CM | POA: Diagnosis present

## 2014-06-05 DIAGNOSIS — Z6841 Body Mass Index (BMI) 40.0 and over, adult: Secondary | ICD-10-CM

## 2014-06-05 DIAGNOSIS — E119 Type 2 diabetes mellitus without complications: Secondary | ICD-10-CM

## 2014-06-05 DIAGNOSIS — Z87891 Personal history of nicotine dependence: Secondary | ICD-10-CM

## 2014-06-05 DIAGNOSIS — I10 Essential (primary) hypertension: Secondary | ICD-10-CM | POA: Diagnosis present

## 2014-06-05 DIAGNOSIS — I129 Hypertensive chronic kidney disease with stage 1 through stage 4 chronic kidney disease, or unspecified chronic kidney disease: Secondary | ICD-10-CM | POA: Diagnosis present

## 2014-06-05 DIAGNOSIS — N182 Chronic kidney disease, stage 2 (mild): Secondary | ICD-10-CM | POA: Diagnosis present

## 2014-06-05 DIAGNOSIS — N189 Chronic kidney disease, unspecified: Secondary | ICD-10-CM | POA: Diagnosis present

## 2014-06-05 DIAGNOSIS — R739 Hyperglycemia, unspecified: Secondary | ICD-10-CM | POA: Diagnosis present

## 2014-06-05 DIAGNOSIS — E1165 Type 2 diabetes mellitus with hyperglycemia: Secondary | ICD-10-CM | POA: Diagnosis present

## 2014-06-05 DIAGNOSIS — I5022 Chronic systolic (congestive) heart failure: Secondary | ICD-10-CM | POA: Diagnosis present

## 2014-06-05 DIAGNOSIS — I2510 Atherosclerotic heart disease of native coronary artery without angina pectoris: Secondary | ICD-10-CM | POA: Diagnosis present

## 2014-06-05 DIAGNOSIS — E860 Dehydration: Secondary | ICD-10-CM | POA: Diagnosis present

## 2014-06-05 DIAGNOSIS — J45909 Unspecified asthma, uncomplicated: Secondary | ICD-10-CM | POA: Diagnosis present

## 2014-06-05 DIAGNOSIS — G4730 Sleep apnea, unspecified: Secondary | ICD-10-CM | POA: Diagnosis present

## 2014-06-05 DIAGNOSIS — N2889 Other specified disorders of kidney and ureter: Secondary | ICD-10-CM | POA: Diagnosis present

## 2014-06-05 DIAGNOSIS — E871 Hypo-osmolality and hyponatremia: Secondary | ICD-10-CM | POA: Diagnosis present

## 2014-06-05 DIAGNOSIS — G4733 Obstructive sleep apnea (adult) (pediatric): Secondary | ICD-10-CM | POA: Diagnosis present

## 2014-06-05 DIAGNOSIS — E1100 Type 2 diabetes mellitus with hyperosmolarity without nonketotic hyperglycemic-hyperosmolar coma (NKHHC): Secondary | ICD-10-CM | POA: Diagnosis present

## 2014-06-05 DIAGNOSIS — N289 Disorder of kidney and ureter, unspecified: Secondary | ICD-10-CM

## 2014-06-05 DIAGNOSIS — R0602 Shortness of breath: Secondary | ICD-10-CM

## 2014-06-05 LAB — CBC
HEMATOCRIT: 46.4 % (ref 39.0–52.0)
Hemoglobin: 15.3 g/dL (ref 13.0–17.0)
MCH: 27.5 pg (ref 26.0–34.0)
MCHC: 33 g/dL (ref 30.0–36.0)
MCV: 83.3 fL (ref 78.0–100.0)
Platelets: 154 10*3/uL (ref 150–400)
RBC: 5.57 MIL/uL (ref 4.22–5.81)
RDW: 12.7 % (ref 11.5–15.5)
WBC: 9 10*3/uL (ref 4.0–10.5)

## 2014-06-05 LAB — CBG MONITORING, ED: Glucose-Capillary: >600 mg/dL (ref 70–99)

## 2014-06-05 MED ORDER — SODIUM CHLORIDE 0.9 % IV BOLUS (SEPSIS)
1000.0000 mL | Freq: Once | INTRAVENOUS | Status: AC
  Administered 2014-06-05: 1000 mL via INTRAVENOUS

## 2014-06-05 MED ORDER — INSULIN ASPART 100 UNIT/ML IV SOLN
10.0000 [IU] | Freq: Once | INTRAVENOUS | Status: AC
  Administered 2014-06-06: 10 [IU] via INTRAVENOUS
  Filled 2014-06-05: qty 1

## 2014-06-05 NOTE — ED Provider Notes (Addendum)
CSN: [id number]     Arrival date & time 06/05/14  2329 History  This chart was scribed for Joel Rice, MD by [name], ED Scribe. This patient was seen in room MH02/MH02 and the patient's care was started at 11:48 PM.    Chief Complaint  Patient presents with  . Hyperglycemia   HPI  HPI Comments: Joel Silva is a 53 y.o. male with a history of CHF, HTN who presents to the Emergency Department complaining of gradually worsening, general fatigue and weakness that started 2 weeks ago. He states increased urination, increased thirst, and an increase in BMs as associated symptoms.  Pt denies recent change in medication and a history of DM. 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 Pt denies leg swelling, dysuria and abdominal pain as associated symptoms. No nausea or vomiting. No fever or chills. No chest pain or difficulty breathing.  PCP [name] at [facility] Past Medical History  Diagnosis Date  . Hypertension   . CHF (congestive heart failure)   . Sleep apnea   . Sleep apnea   . Coronary artery disease   . Asthma    Past Surgical History  Procedure Laterality Date  . Coronary angioplasty with stent placement    . Appendectomy     History reviewed. No pertinent family history. History  Substance Use Topics  . Smoking status: Former Smoker -- 2 years  . Smokeless tobacco: Never Used  . Alcohol Use: 0.6 oz/week    1 Cans of beer per week    Review of Systems  Constitutional: Positive for fatigue. Negative for fever and chills.  Respiratory: Negative for cough and shortness of breath.   Cardiovascular: Negative for chest pain, palpitations and leg swelling.  Gastrointestinal: Negative for nausea, vomiting, abdominal pain and diarrhea.  Endocrine: Positive for polydipsia and polyuria.  Genitourinary: Positive for frequency. Negative for dysuria, flank pain and difficulty urinating.  Skin: Negative for rash and  wound.  Neurological: Positive for weakness (generalized). Negative for dizziness, light-headedness, numbness and headaches.  All other systems reviewed and are negative.     Allergies  Review of patient's allergies indicates no known allergies.  Home Medications   Prior to Admission medications   Medication Sig Start Date End Date Taking? Authorizing Provider  albuterol (PROVENTIL HFA;VENTOLIN HFA) 108 (90 BASE) MCG/ACT inhaler Inhale 2 puffs into the lungs every 6 (six) hours as needed for wheezing.   Yes Historical Provider, MD  atorvastatin (LIPITOR) 40 MG tablet Take 40 mg by mouth daily.   Yes Historical Provider, MD  bumetanide (BUMEX) 2 MG tablet Take 2 mg by mouth daily.   Yes Historical Provider, MD  carvedilol (COREG) 12.5 MG tablet Take 12.5 mg by mouth 2 (two) times daily with a meal.   Yes Historical Provider, MD  lisinopril (PRINIVIL,ZESTRIL) 5 MG tablet Take 5 mg by mouth daily.   Yes Historical Provider, MD  spironolactone (ALDACTONE) 25 MG tablet Take 1 tablet by mouth daily. PLEASE SCHEDULE OFFICE VISIT. 04/17/13  Yes [name], MD  Blood Glucose Monitoring Suppl (BLOOD GLUCOSE METER KIT AND SUPPLIES) Dispense based on patient and insurance preference. Use up to four times daily as directed. (FOR ICD-9 250.00, 250.01). 06/09/14   [name], MD  insulin aspart (NOVOLOG) 100 UNIT/ML FlexPen Inject 6 Units into the skin 3 (three) times daily with meals. 06/09/14   [name], MD  Insulin Glargine (LANTUS SOLOSTAR) 100 UNIT/ML  Solostar Pen Inject 42 Units into the skin daily at 10 pm. 06/09/14   [name], MD  Insulin Pen Needle 31G X 5 MM MISC 6-42 Units by Does not apply route 4 (four) times daily -  before meals and at bedtime. 06/09/14   [name], MD   BP 131/74 mmHg  Pulse 69  Temp(Src) 97.5 F (36.4 C) (Oral)  Resp 20  Ht 6' (1.829 m)  Wt 281 lb 12 oz (127.8 kg)  BMI 38.20 kg/m2  SpO2 100% Physical Exam  Constitutional: He is  oriented to person, place, and time. He appears well-developed and well-nourished. No distress.  HENT:  Head: Normocephalic and atraumatic.  Mouth/Throat: Oropharynx is clear and moist.  Eyes: EOM are normal. Pupils are equal, round, and reactive to light.  Neck: Normal range of motion. Neck supple.  Cardiovascular: Normal rate and regular rhythm.   Pulmonary/Chest: Effort normal and breath sounds normal. No respiratory distress. He has no wheezes. He has no rales. He exhibits no tenderness.  Abdominal: Soft. Bowel sounds are normal. He exhibits no distension and no mass. There is no tenderness. There is no rebound and no guarding.  Musculoskeletal: Normal range of motion. He exhibits no edema or tenderness.  No calf swelling or tenderness.  Neurological: He is alert and oriented to person, place, and time.  5/5 motor in all extremities. Sensation is intact.  Skin: Skin is warm and dry. No rash noted. No erythema.  Psychiatric: He has a normal mood and affect. His behavior is normal.  Nursing note and vitals reviewed.   ED Course  Procedures (including critical care time) DIAGNOSTIC STUDIES: Oxygen Saturation is 99% on RA, normal by my interpretation.    COORDINATION OF CARE: 11:50 PM Discussed treatment plan with pt which includes lab work. Pt agreed to plan.  Labs Review Labs Reviewed  COMPREHENSIVE METABOLIC PANEL - Abnormal; Notable for the following:    Sodium 120 (*)    Potassium 5.4 (*)    Chloride 78 (*)    Glucose, Bld 1178 (*)    BUN 32 (*)    Creatinine, Ser 1.40 (*)    Calcium 10.8 (*)    Total Protein 8.6 (*)    ALT 54 (*)    Alkaline Phosphatase 135 (*)    GFR calc non Af Amer 57 (*)    GFR calc Af Amer 66 (*)    Anion gap 18 (*)    All other components within normal limits  URINALYSIS, ROUTINE W REFLEX MICROSCOPIC - Abnormal; Notable for the following:    Glucose, UA >1000 (*)    All other components within normal limits  BASIC METABOLIC PANEL - Abnormal;  Notable for the following:    Sodium 125 (*)    Chloride 83 (*)    Glucose, Bld 851 (*)    BUN 32 (*)    Creatinine, Ser 1.60 (*)    Calcium 11.1 (*)    GFR calc non Af Amer 48 (*)    GFR calc Af Amer 56 (*)    Anion gap 17 (*)    All other components within normal limits  PRO B NATRIURETIC PEPTIDE - Abnormal; Notable for the following:    Pro B Natriuretic peptide (BNP) 156.9 (*)    All other components within normal limits  BASIC METABOLIC PANEL - Abnormal; Notable for the following:    Sodium 131 (*)    Chloride 90 (*)    Glucose, Bld 597 (*)  BUN 31 (*)    Creatinine, Ser 1.70 (*)    Calcium 10.8 (*)    GFR calc non Af Amer 45 (*)    GFR calc Af Amer 52 (*)    Anion gap 18 (*)    All other components within normal limits  BASIC METABOLIC PANEL - Abnormal; Notable for the following:    Glucose, Bld 273 (*)    BUN 29 (*)    Creatinine, Ser 1.50 (*)    GFR calc non Af Amer 52 (*)    GFR calc Af Amer 61 (*)    Anion gap 16 (*)    All other components within normal limits  BASIC METABOLIC PANEL - Abnormal; Notable for the following:    Glucose, Bld 172 (*)    BUN 26 (*)    Creatinine, Ser 1.51 (*)    GFR calc non Af Amer 52 (*)    GFR calc Af Amer 60 (*)    All other components within normal limits  BASIC METABOLIC PANEL - Abnormal; Notable for the following:    Sodium 136 (*)    Potassium 3.6 (*)    Glucose, Bld 125 (*)    BUN 24 (*)    Creatinine, Ser 1.46 (*)    GFR calc non Af Amer 54 (*)    GFR calc Af Amer 63 (*)    Anion gap 16 (*)    All other components within normal limits  BASIC METABOLIC PANEL - Abnormal; Notable for the following:    Potassium 3.5 (*)    Glucose, Bld 144 (*)    BUN 25 (*)    Creatinine, Ser 1.57 (*)    GFR calc non Af Amer 49 (*)    GFR calc Af Amer 57 (*)    All other components within normal limits  HEMOGLOBIN A1C - Abnormal; Notable for the following:    Hgb A1c MFr Bld 13.2 (*)    Mean Plasma Glucose 332 (*)    All other  components within normal limits  GLUCOSE, CAPILLARY - Abnormal; Notable for the following:    Glucose-Capillary 156 (*)    All other components within normal limits  GLUCOSE, CAPILLARY - Abnormal; Notable for the following:    Glucose-Capillary 189 (*)    All other components within normal limits  GLUCOSE, CAPILLARY - Abnormal; Notable for the following:    Glucose-Capillary 203 (*)    All other components within normal limits  GLUCOSE, CAPILLARY - Abnormal; Notable for the following:    Glucose-Capillary 200 (*)    All other components within normal limits  GLUCOSE, CAPILLARY - Abnormal; Notable for the following:    Glucose-Capillary 205 (*)    All other components within normal limits  GLUCOSE, CAPILLARY - Abnormal; Notable for the following:    Glucose-Capillary 183 (*)    All other components within normal limits  BASIC METABOLIC PANEL - Abnormal; Notable for the following:    Glucose, Bld 131 (*)    Creatinine, Ser 1.43 (*)    GFR calc non Af Amer 55 (*)    GFR calc Af Amer 64 (*)    All other components within normal limits  GLUCOSE, CAPILLARY - Abnormal; Notable for the following:    Glucose-Capillary 185 (*)    All other components within normal limits  GLUCOSE, CAPILLARY - Abnormal; Notable for the following:    Glucose-Capillary 139 (*)    All other components within normal limits  GLUCOSE, CAPILLARY - Abnormal;  Notable for the following:    Glucose-Capillary 138 (*)    All other components within normal limits  GLUCOSE, CAPILLARY - Abnormal; Notable for the following:    Glucose-Capillary 139 (*)    All other components within normal limits  BASIC METABOLIC PANEL - Abnormal; Notable for the following:    Glucose, Bld 156 (*)    Creatinine, Ser 1.36 (*)    GFR calc non Af Amer 59 (*)    GFR calc Af Amer 68 (*)    All other components within normal limits  GLUCOSE, CAPILLARY - Abnormal; Notable for the following:    Glucose-Capillary 152 (*)    All other  components within normal limits  GLUCOSE, CAPILLARY - Abnormal; Notable for the following:    Glucose-Capillary 161 (*)    All other components within normal limits  GLUCOSE, CAPILLARY - Abnormal; Notable for the following:    Glucose-Capillary 167 (*)    All other components within normal limits  GLUCOSE, CAPILLARY - Abnormal; Notable for the following:    Glucose-Capillary 152 (*)    All other components within normal limits  GLUCOSE, CAPILLARY - Abnormal; Notable for the following:    Glucose-Capillary 168 (*)    All other components within normal limits  GLUCOSE, CAPILLARY - Abnormal; Notable for the following:    Glucose-Capillary 160 (*)    All other components within normal limits  GLUCOSE, CAPILLARY - Abnormal; Notable for the following:    Glucose-Capillary 160 (*)    All other components within normal limits  GLUCOSE, CAPILLARY - Abnormal; Notable for the following:    Glucose-Capillary 128 (*)    All other components within normal limits  GLUCOSE, CAPILLARY - Abnormal; Notable for the following:    Glucose-Capillary 173 (*)    All other components within normal limits  GLUCOSE, CAPILLARY - Abnormal; Notable for the following:    Glucose-Capillary 191 (*)    All other components within normal limits  BASIC METABOLIC PANEL - Abnormal; Notable for the following:    Sodium 135 (*)    Glucose, Bld 264 (*)    GFR calc non Af Amer 62 (*)    GFR calc Af Amer 72 (*)    All other components within normal limits  GLUCOSE, CAPILLARY - Abnormal; Notable for the following:    Glucose-Capillary 341 (*)    All other components within normal limits  LIPID PANEL - Abnormal; Notable for the following:    Triglycerides 153 (*)    HDL 37 (*)    All other components within normal limits  GLUCOSE, CAPILLARY - Abnormal; Notable for the following:    Glucose-Capillary 333 (*)    All other components within normal limits  GLUCOSE, CAPILLARY - Abnormal; Notable for the following:     Glucose-Capillary 322 (*)    All other components within normal limits  GLUCOSE, CAPILLARY - Abnormal; Notable for the following:    Glucose-Capillary 330 (*)    All other components within normal limits  BASIC METABOLIC PANEL - Abnormal; Notable for the following:    Glucose, Bld 252 (*)    GFR calc non Af Amer 68 (*)    GFR calc Af Amer 79 (*)    All other components within normal limits  GLUCOSE, CAPILLARY - Abnormal; Notable for the following:    Glucose-Capillary 340 (*)    All other components within normal limits  GLUCOSE, CAPILLARY - Abnormal; Notable for the following:    Glucose-Capillary 229 (*)  All other components within normal limits  GLUCOSE, CAPILLARY - Abnormal; Notable for the following:    Glucose-Capillary 268 (*)    All other components within normal limits  GLUCOSE, CAPILLARY - Abnormal; Notable for the following:    Glucose-Capillary 320 (*)    All other components within normal limits  GLUCOSE, CAPILLARY - Abnormal; Notable for the following:    Glucose-Capillary 383 (*)    All other components within normal limits  CBG MONITORING, ED - Abnormal; Notable for the following:    Glucose-Capillary >600 (*)    All other components within normal limits  CBG MONITORING, ED - Abnormal; Notable for the following:    Glucose-Capillary >600 (*)    All other components within normal limits  CBG MONITORING, ED - Abnormal; Notable for the following:    Glucose-Capillary >600 (*)    All other components within normal limits  CBG MONITORING, ED - Abnormal; Notable for the following:    Glucose-Capillary >600 (*)    All other components within normal limits  CBG MONITORING, ED - Abnormal; Notable for the following:    Glucose-Capillary >600 (*)    All other components within normal limits  CBG MONITORING, ED - Abnormal; Notable for the following:    Glucose-Capillary 543 (*)    All other components within normal limits  CBG MONITORING, ED - Abnormal; Notable for  the following:    Glucose-Capillary >600 (*)    All other components within normal limits  CBG MONITORING, ED - Abnormal; Notable for the following:    Glucose-Capillary 535 (*)    All other components within normal limits  CBG MONITORING, ED - Abnormal; Notable for the following:    Glucose-Capillary 516 (*)    All other components within normal limits  CBG MONITORING, ED - Abnormal; Notable for the following:    Glucose-Capillary 445 (*)    All other components within normal limits  CBG MONITORING, ED - Abnormal; Notable for the following:    Glucose-Capillary 331 (*)    All other components within normal limits  CBG MONITORING, ED - Abnormal; Notable for the following:    Glucose-Capillary 217 (*)    All other components within normal limits  MRSA PCR SCREENING  CBC  URINE MICROSCOPIC-ADD ON  MAGNESIUM  TROPONIN I  TROPONIN I  CBC  CBC    Imaging Review No results found.   EKG Interpretation None     CRITICAL CARE Performed by: Lita Mains, Haidynn Almendarez Total critical care time: 60 min Critical care time was exclusive of separately billable procedures and treating other patients. Critical care was necessary to treat or prevent imminent or life-threatening deterioration. Critical care was time spent personally by me on the following activities: development of treatment plan with patient and/or surrogate as well as nursing, discussions with consultants, evaluation of patient's response to treatment, examination of patient, obtaining history from patient or surrogate, ordering and performing treatments and interventions, ordering and review of laboratory studies, ordering and review of radiographic studies, pulse oximetry and re-evaluation of patient's condition. MDM   Final diagnoses:  Hyperglycemia  Renal insufficiency    I personally performed the services described in this documentation, which was scribed in my presence. The recorded information has been reviewed and is  accurate.    Discussed with hospitalist at [facility], Dr Buelah Manis. No monitored beds available at this point. Anticipate that availability at 8 AM. Discussed with patient. States he would prefer waiting for bed availability. We'll continue to  monitor closely in the emergency department. Patient has been started on an insulin drip. He also has been started on IV fluids which is being used judiciously given his history of heart failure.  Patient became diaphoretic and complaining of diffuse muscle cramping. Insulin drip was stopped and given 500 mL bolus of IV fluids as well as Ativan and small dose of morphine. Reassessment of the lungs were clear and patient was satting high 90s on room air. Will give IV fluids and continue reassess.   Patient is now resting comfortably. Elevated glucose is gradually resolving. Lungs remain clear without evidence of overload. Patient still insisted on being transferred to [facility].  Joel Rice, MD 06/12/14 [id number]  Joel Rice, MD 06/12/14 [phone]

## 2014-06-05 NOTE — ED Notes (Signed)
Pt c/o increased fatigue urination and thirst x 2 weeks

## 2014-06-06 ENCOUNTER — Inpatient Hospital Stay (HOSPITAL_COMMUNITY): Payer: Medicaid Other

## 2014-06-06 ENCOUNTER — Encounter (HOSPITAL_COMMUNITY): Payer: Self-pay | Admitting: Physician Assistant

## 2014-06-06 DIAGNOSIS — E1100 Type 2 diabetes mellitus with hyperosmolarity without nonketotic hyperglycemic-hyperosmolar coma (NKHHC): Secondary | ICD-10-CM | POA: Diagnosis present

## 2014-06-06 DIAGNOSIS — G4733 Obstructive sleep apnea (adult) (pediatric): Secondary | ICD-10-CM | POA: Diagnosis present

## 2014-06-06 DIAGNOSIS — R739 Hyperglycemia, unspecified: Secondary | ICD-10-CM | POA: Diagnosis present

## 2014-06-06 DIAGNOSIS — Z6841 Body Mass Index (BMI) 40.0 and over, adult: Secondary | ICD-10-CM | POA: Diagnosis not present

## 2014-06-06 DIAGNOSIS — I5022 Chronic systolic (congestive) heart failure: Secondary | ICD-10-CM | POA: Diagnosis present

## 2014-06-06 DIAGNOSIS — E119 Type 2 diabetes mellitus without complications: Secondary | ICD-10-CM

## 2014-06-06 DIAGNOSIS — J45909 Unspecified asthma, uncomplicated: Secondary | ICD-10-CM | POA: Diagnosis present

## 2014-06-06 DIAGNOSIS — E871 Hypo-osmolality and hyponatremia: Secondary | ICD-10-CM | POA: Diagnosis present

## 2014-06-06 DIAGNOSIS — Z87891 Personal history of nicotine dependence: Secondary | ICD-10-CM | POA: Diagnosis not present

## 2014-06-06 DIAGNOSIS — N182 Chronic kidney disease, stage 2 (mild): Secondary | ICD-10-CM | POA: Diagnosis present

## 2014-06-06 DIAGNOSIS — N189 Chronic kidney disease, unspecified: Secondary | ICD-10-CM

## 2014-06-06 DIAGNOSIS — I2510 Atherosclerotic heart disease of native coronary artery without angina pectoris: Secondary | ICD-10-CM | POA: Diagnosis present

## 2014-06-06 DIAGNOSIS — E1165 Type 2 diabetes mellitus with hyperglycemia: Secondary | ICD-10-CM | POA: Diagnosis present

## 2014-06-06 DIAGNOSIS — I10 Essential (primary) hypertension: Secondary | ICD-10-CM

## 2014-06-06 DIAGNOSIS — E1101 Type 2 diabetes mellitus with hyperosmolarity with coma: Secondary | ICD-10-CM

## 2014-06-06 DIAGNOSIS — N179 Acute kidney failure, unspecified: Secondary | ICD-10-CM

## 2014-06-06 DIAGNOSIS — I129 Hypertensive chronic kidney disease with stage 1 through stage 4 chronic kidney disease, or unspecified chronic kidney disease: Secondary | ICD-10-CM | POA: Diagnosis present

## 2014-06-06 DIAGNOSIS — E860 Dehydration: Secondary | ICD-10-CM | POA: Diagnosis present

## 2014-06-06 LAB — BASIC METABOLIC PANEL
ANION GAP: 17 — AB (ref 5–15)
ANION GAP: 15 (ref 5–15)
ANION GAP: 16 — AB (ref 5–15)
Anion gap: 18 — ABNORMAL HIGH (ref 5–15)
Anion gap: 16 — ABNORMAL HIGH (ref 5–15)
BUN: 32 mg/dL — ABNORMAL HIGH (ref 6–23)
BUN: 31 mg/dL — ABNORMAL HIGH (ref 6–23)
BUN: 29 mg/dL — AB (ref 6–23)
BUN: 26 mg/dL — ABNORMAL HIGH (ref 6–23)
BUN: 24 mg/dL — ABNORMAL HIGH (ref 6–23)
CALCIUM: 11.1 mg/dL — AB (ref 8.4–10.5)
CALCIUM: 10.8 mg/dL — AB (ref 8.4–10.5)
CALCIUM: 10.5 mg/dL (ref 8.4–10.5)
CALCIUM: 9.5 mg/dL (ref 8.4–10.5)
CHLORIDE: 83 meq/L — AB (ref 96–112)
CO2: 25 mEq/L (ref 19–32)
CO2: 23 meq/L (ref 19–32)
CO2: 25 mEq/L (ref 19–32)
CO2: 24 mEq/L (ref 19–32)
CO2: 24 mEq/L (ref 19–32)
CREATININE: 1.7 mg/dL — AB (ref 0.50–1.35)
CREATININE: 1.5 mg/dL — AB (ref 0.50–1.35)
Calcium: 9.5 mg/dL (ref 8.4–10.5)
Chloride: 90 mEq/L — ABNORMAL LOW (ref 96–112)
Chloride: 96 mEq/L (ref 96–112)
Chloride: 99 mEq/L (ref 96–112)
Chloride: 96 mEq/L (ref 96–112)
Creatinine, Ser: 1.6 mg/dL — ABNORMAL HIGH (ref 0.50–1.35)
Creatinine, Ser: 1.51 mg/dL — ABNORMAL HIGH (ref 0.50–1.35)
Creatinine, Ser: 1.46 mg/dL — ABNORMAL HIGH (ref 0.50–1.35)
GFR calc Af Amer: 56 mL/min — ABNORMAL LOW (ref >90)
GFR calc Af Amer: 52 mL/min — ABNORMAL LOW (ref >90)
GFR calc Af Amer: 61 mL/min — ABNORMAL LOW (ref >90)
GFR calc Af Amer: 60 mL/min — ABNORMAL LOW (ref >90)
GFR calc Af Amer: 63 mL/min — ABNORMAL LOW (ref >90)
GFR calc non Af Amer: 48 mL/min — ABNORMAL LOW (ref >90)
GFR calc non Af Amer: 52 mL/min — ABNORMAL LOW (ref >90)
GFR, EST NON AFRICAN AMERICAN: 45 mL/min — AB (ref >90)
GFR, EST NON AFRICAN AMERICAN: 52 mL/min — AB (ref >90)
GFR, EST NON AFRICAN AMERICAN: 54 mL/min — AB (ref >90)
GLUCOSE: 851 mg/dL — AB (ref 70–99)
GLUCOSE: 273 mg/dL — AB (ref 70–99)
Glucose, Bld: 597 mg/dL (ref 70–99)
Glucose, Bld: 172 mg/dL — ABNORMAL HIGH (ref 70–99)
Glucose, Bld: 125 mg/dL — ABNORMAL HIGH (ref 70–99)
POTASSIUM: 4.1 meq/L (ref 3.7–5.3)
Potassium: 4.7 mEq/L (ref 3.7–5.3)
Potassium: 4.6 mEq/L (ref 3.7–5.3)
Potassium: 3.9 mEq/L (ref 3.7–5.3)
Potassium: 3.6 mEq/L — ABNORMAL LOW (ref 3.7–5.3)
SODIUM: 125 meq/L — AB (ref 137–147)
SODIUM: 131 meq/L — AB (ref 137–147)
SODIUM: 138 meq/L (ref 137–147)
SODIUM: 136 meq/L — AB (ref 137–147)
Sodium: 137 mEq/L (ref 137–147)

## 2014-06-06 LAB — URINALYSIS, ROUTINE W REFLEX MICROSCOPIC
Bilirubin Urine: NEGATIVE
Glucose, UA: >1000 mg/dL — AB
HGB URINE DIPSTICK: NEGATIVE
KETONES UR: NEGATIVE mg/dL
Leukocytes, UA: NEGATIVE
NITRITE: NEGATIVE
PROTEIN: NEGATIVE mg/dL
SPECIFIC GRAVITY, URINE: 1.028 (ref 1.005–1.030)
UROBILINOGEN UA: 0.2 mg/dL (ref 0.0–1.0)
pH: 5 (ref 5.0–8.0)

## 2014-06-06 LAB — COMPREHENSIVE METABOLIC PANEL
ALT: 54 U/L — ABNORMAL HIGH (ref 0–53)
ANION GAP: 18 — AB (ref 5–15)
AST: 31 U/L (ref 0–37)
Albumin: 4.1 g/dL (ref 3.5–5.2)
Alkaline Phosphatase: 135 U/L — ABNORMAL HIGH (ref 39–117)
BILIRUBIN TOTAL: 0.4 mg/dL (ref 0.3–1.2)
BUN: 32 mg/dL — AB (ref 6–23)
CO2: 24 mEq/L (ref 19–32)
CREATININE: 1.4 mg/dL — AB (ref 0.50–1.35)
Calcium: 10.8 mg/dL — ABNORMAL HIGH (ref 8.4–10.5)
Chloride: 78 mEq/L — ABNORMAL LOW (ref 96–112)
GFR calc non Af Amer: 57 mL/min — ABNORMAL LOW (ref >90)
GFR, EST AFRICAN AMERICAN: 66 mL/min — AB (ref >90)
Glucose, Bld: 1178 mg/dL (ref 70–99)
Potassium: 5.4 mEq/L — ABNORMAL HIGH (ref 3.7–5.3)
Sodium: 120 mEq/L — CL (ref 137–147)
Total Protein: 8.6 g/dL — ABNORMAL HIGH (ref 6.0–8.3)

## 2014-06-06 LAB — CBG MONITORING, ED
GLUCOSE-CAPILLARY: 543 mg/dL — AB (ref 70–99)
Glucose-Capillary: 217 mg/dL — ABNORMAL HIGH (ref 70–99)
Glucose-Capillary: 331 mg/dL — ABNORMAL HIGH (ref 70–99)
Glucose-Capillary: 445 mg/dL — ABNORMAL HIGH (ref 70–99)
Glucose-Capillary: 516 mg/dL — ABNORMAL HIGH (ref 70–99)
Glucose-Capillary: 535 mg/dL — ABNORMAL HIGH (ref 70–99)
Glucose-Capillary: >600 mg/dL (ref 70–99)

## 2014-06-06 LAB — GLUCOSE, CAPILLARY
GLUCOSE-CAPILLARY: 200 mg/dL — AB (ref 70–99)
GLUCOSE-CAPILLARY: 183 mg/dL — AB (ref 70–99)
GLUCOSE-CAPILLARY: 185 mg/dL — AB (ref 70–99)
GLUCOSE-CAPILLARY: 139 mg/dL — AB (ref 70–99)
Glucose-Capillary: 156 mg/dL — ABNORMAL HIGH (ref 70–99)
Glucose-Capillary: 189 mg/dL — ABNORMAL HIGH (ref 70–99)
Glucose-Capillary: 203 mg/dL — ABNORMAL HIGH (ref 70–99)
Glucose-Capillary: 205 mg/dL — ABNORMAL HIGH (ref 70–99)
Glucose-Capillary: 138 mg/dL — ABNORMAL HIGH (ref 70–99)
Glucose-Capillary: 139 mg/dL — ABNORMAL HIGH (ref 70–99)

## 2014-06-06 LAB — MRSA PCR SCREENING: MRSA BY PCR: NEGATIVE

## 2014-06-06 LAB — URINE MICROSCOPIC-ADD ON

## 2014-06-06 LAB — PRO B NATRIURETIC PEPTIDE: Pro B Natriuretic peptide (BNP): 156.9 pg/mL — ABNORMAL HIGH (ref 0–125)

## 2014-06-06 LAB — HEMOGLOBIN A1C
Hgb A1c MFr Bld: 13.2 % — ABNORMAL HIGH (ref <5.7)
MEAN PLASMA GLUCOSE: 332 mg/dL — AB (ref <117)

## 2014-06-06 LAB — TROPONIN I: Troponin I: <0.3 ng/mL (ref <0.30)

## 2014-06-06 LAB — MAGNESIUM: Magnesium: 2.3 mg/dL (ref 1.5–2.5)

## 2014-06-06 MED ORDER — SODIUM CHLORIDE 0.9 % IV SOLN
INTRAVENOUS | Status: DC
  Filled 2014-06-06: qty 2.5

## 2014-06-06 MED ORDER — DEXTROSE-NACL 5-0.45 % IV SOLN: INTRAVENOUS | Status: DC

## 2014-06-06 MED ORDER — INSULIN REGULAR HUMAN 100 UNIT/ML IJ SOLN
INTRAMUSCULAR | Status: AC
  Filled 2014-06-06: qty 1

## 2014-06-06 MED ORDER — LORAZEPAM 2 MG/ML IJ SOLN
INTRAMUSCULAR | Status: AC
  Administered 2014-06-06: 0.5 mg
  Filled 2014-06-06: qty 1

## 2014-06-06 MED ORDER — ACETAMINOPHEN 325 MG PO TABS: 650.0000 mg | ORAL_TABLET | Freq: Four times a day (QID) | ORAL | Status: DC | PRN

## 2014-06-06 MED ORDER — SODIUM CHLORIDE 0.9 % IV SOLN: INTRAVENOUS | Status: DC

## 2014-06-06 MED ORDER — DEXTROSE-NACL 5-0.45 % IV SOLN
INTRAVENOUS | Status: DC
  Administered 2014-06-06: 12:00:00 via INTRAVENOUS

## 2014-06-06 MED ORDER — PANTOPRAZOLE SODIUM 40 MG PO TBEC
40.0000 mg | DELAYED_RELEASE_TABLET | Freq: Every day | ORAL | Status: DC
  Administered 2014-06-06 – 2014-06-09 (×4): 40 mg via ORAL
  Filled 2014-06-06 (×3): qty 1

## 2014-06-06 MED ORDER — ONDANSETRON HCL 4 MG/2ML IJ SOLN: 4.0000 mg | Freq: Four times a day (QID) | INTRAMUSCULAR | Status: DC | PRN

## 2014-06-06 MED ORDER — ENOXAPARIN SODIUM 40 MG/0.4ML ~~LOC~~ SOLN
40.0000 mg | SUBCUTANEOUS | Status: DC
  Administered 2014-06-06 – 2014-06-08 (×3): 40 mg via SUBCUTANEOUS
  Filled 2014-06-06 (×4): qty 0.4

## 2014-06-06 MED ORDER — ALBUTEROL SULFATE (2.5 MG/3ML) 0.083% IN NEBU: 3.0000 mL | INHALATION_SOLUTION | Freq: Four times a day (QID) | RESPIRATORY_TRACT | Status: DC | PRN

## 2014-06-06 MED ORDER — MORPHINE SULFATE 4 MG/ML IJ SOLN
INTRAMUSCULAR | Status: AC
  Administered 2014-06-06: 4 mg
  Filled 2014-06-06: qty 1

## 2014-06-06 MED ORDER — SODIUM CHLORIDE 0.9 % IV BOLUS (SEPSIS)
500.0000 mL | Freq: Once | INTRAVENOUS | Status: AC
  Administered 2014-06-06: 500 mL via INTRAVENOUS

## 2014-06-06 MED ORDER — LORAZEPAM 2 MG/ML IJ SOLN
INTRAMUSCULAR | Status: AC
  Administered 2014-06-06: 0.5 mg via INTRAVENOUS
  Filled 2014-06-06: qty 1

## 2014-06-06 MED ORDER — SODIUM CHLORIDE 0.9 % IV SOLN
Freq: Once | INTRAVENOUS | Status: AC
  Administered 2014-06-06: 03:00:00 via INTRAVENOUS

## 2014-06-06 MED ORDER — DEXTROSE-NACL 5-0.45 % IV SOLN
INTRAVENOUS | Status: DC
Start: 2014-06-06 — End: 2014-06-07
  Administered 2014-06-06: 19:00:00 via INTRAVENOUS

## 2014-06-06 MED ORDER — LORAZEPAM 2 MG/ML IJ SOLN
0.5000 mg | Freq: Once | INTRAMUSCULAR | Status: AC
  Administered 2014-06-06: 0.5 mg via INTRAVENOUS

## 2014-06-06 MED ORDER — SODIUM CHLORIDE 0.9 % IV SOLN
INTRAVENOUS | Status: DC
  Administered 2014-06-06: 5.4 [IU]/h via INTRAVENOUS

## 2014-06-06 MED ORDER — CARVEDILOL 12.5 MG PO TABS
12.5000 mg | ORAL_TABLET | Freq: Two times a day (BID) | ORAL | Status: DC
  Administered 2014-06-06 – 2014-06-09 (×6): 12.5 mg via ORAL
  Filled 2014-06-06 (×8): qty 1

## 2014-06-06 MED ORDER — SODIUM CHLORIDE 0.9 % IV SOLN: INTRAVENOUS | Status: AC

## 2014-06-06 NOTE — ED Notes (Signed)
Pt resting now with no complaints

## 2014-06-06 NOTE — ED Notes (Addendum)
INSULIN drip was changed to 21 units/hr at 0800.

## 2014-06-06 NOTE — ED Notes (Signed)
[facility] called, stating that pt would not have a ready bed until this after. MD Wofford made aware and is discussing this with the patient at this time.

## 2014-06-06 NOTE — ED Notes (Signed)
Increased Insulin drip to 22.8cc/hr.

## 2014-06-06 NOTE — ED Notes (Signed)
New bag Insulin 250 units/250cc NS hung.

## 2014-06-06 NOTE — ED Notes (Addendum)
Pt resting with eyes closed. Pt denies pain. 20 ga SL to left hand and [facility] at 125cc/hr and Insulin at 21cc/hr.

## 2014-06-06 NOTE — ED Notes (Signed)
[facility] at bedside for transport. 

## 2014-06-06 NOTE — H&P (Signed)
[facility] History and Physical  Rahm Minix VWU:[id number] DOB: 05-25-1961 DOA: 06/05/2014  Referring physician: Loren Racer, MD PCP: No primary care provider on file.   Chief Complaint: Increased thirst, generalized weakness  HPI: Joel Silva is a 53 y.o. male with PMH of Chronic systolic CHF (2D Echo 2013- EF 35-40%), CKD, stage II, CAD, HTN, Sleep apnea (CPAP at home), and Morbid obesity that presented to [facility] on 12/1 with increases thirst and generalized weakness for the past two weeks.  He was hyperglycemic with CBGs  in 1100s, without evidence of ketones.  He was place on Insulin drip and given IV fluids. Patient subsequently developed leg cramping and was given Ativan and morphine. He was then instructed to come [facility] for further management. Patient states was drinking gallons of water due to polydipsia and subsequently has had increased urination.  He denies any fever, chills, chest pain, sob, nausea, vomiting, abd pain, dysuria, diarrhea, or leg swelling.  He denies recent illness or any changes in medications. Pt works as a Naval architect, and admits to not eating good diet or exercising regularly.  He denies any known history of diabetes, and states he recently had physical exam with PCP DR.  Jones at [facility], with no mention of diabetes.  However, after review of his records, his CBGs on March were elevated in 300s, but there is no evidence of follow up Hgb A1C.    His family history is positive for diabetes in mother.  He denies any smoking, drinks 2-3 beers/month, and denies illict drug use.    Review of Systems:  Constitutional:  No weight loss, night sweats, Fevers, chills, fatigue.  HEENT:  No headaches, Difficulty swallowing,Tooth/dental problems,Sore throat,  No sneezing, itching, ear ache, nasal congestion, post nasal drip,  Cardio-vascular:  No chest pain, Orthopnea, PND, swelling in lower extremities, anasarca, dizziness, palpitations  GI:  No  heartburn, indigestion, abdominal pain, nausea, vomiting, diarrhea, change in bowel habits, loss of appetite  Resp:  No shortness of breath with exertion or at rest. No excess mucus, no productive cough, No non-productive cough, No coughing up of blood.No change in color of mucus.No wheezing.No chest wall deformity  Skin:  no rash or lesions.  GU:  no dysuria, change in color of urine, no urgency or frequency. No flank pain.  Endocrine:  Polydipsia, polyuria Musculoskeletal:  No joint pain or swelling. No decreased range of motion. No back pain.  Psych:  No change in mood or affect. No depression or anxiety. No memory loss.  Neuro:  Generalized weakness  Past Medical History  Diagnosis Date  . Hypertension   . CHF (congestive heart failure)   . Sleep apnea   . Sleep apnea   . Coronary artery disease   . Asthma    Past Surgical History  Procedure Laterality Date  . Coronary angioplasty with stent placement    . Appendectomy     Social History:  reports that he has quit smoking. He has never used smokeless tobacco. He reports that he drinks about 0.6 oz of alcohol per week. He reports that he does not use illicit drugs.  No Known Allergies  History reviewed. No pertinent family history.   Prior to Admission medications   Medication Sig Start Date End Date Taking? Authorizing Provider  bumetanide (BUMEX) 2 MG tablet Take 2 mg by mouth daily.   Yes Historical Provider, MD  albuterol (PROVENTIL HFA;VENTOLIN HFA) 108 (90 BASE) MCG/ACT inhaler Inhale 2 puffs into the lungs  every 6 (six) hours as needed for wheezing.    Historical Provider, MD  carvedilol (COREG) 12.5 MG tablet Take 1 tablet (12.5 mg total) by mouth 2 (two) times daily with a meal. 09/29/11 09/28/12  Altha HarmMichelle A Matthews, MD  carvedilol (COREG) 12.5 MG tablet Take 12.5 mg by mouth 2 (two) times daily with a meal.    Historical Provider, MD  furosemide (LASIX) 40 MG tablet Take 1 tablet (40 mg total) by mouth daily.  09/29/11 09/28/12  Altha HarmMichelle A Matthews, MD  lisinopril (PRINIVIL,ZESTRIL) 10 MG tablet Take 1 tablet (10 mg total) by mouth daily. 09/29/11 09/28/12  Altha HarmMichelle A Matthews, MD  lisinopril (PRINIVIL,ZESTRIL) 40 MG tablet Take 1 tablet by mouth daily. PLEASE SCHEDULE OFFICE VISIT. 04/17/13   [name], MD  spironolactone (ALDACTONE) 25 MG tablet Take 1 tablet by mouth daily. PLEASE SCHEDULE OFFICE VISIT. 04/17/13   Thurmon FairMihai Croitoru, MD   Physical Exam: Filed Vitals:   06/06/14 1400 06/06/14 1415 06/06/14 1430 06/06/14 1445  BP: 119/90 99/74 123/73 163/76  Pulse: 41 90 85 86  Temp:      TempSrc:      Resp: 12 16 13 18   Height:      Weight:      SpO2: 96% 96% 96% 89%    Wt Readings from Last 3 Encounters:  06/06/14 127.6 kg (281 lb 4.9 oz)  04/15/13 136.079 kg (300 lb)  09/29/11 129.184 kg (284 lb 12.8 oz)    General:  Alert obese AA male in NAD.  Appears somnolent. [place] Eyes: PERRL, normal lids, irises & conjunctiva ENT: grossly normal hearing, lips & tongue.  Dry mucosa Neck: no LAD, masses or thyromegaly Cardiovascular: RRR, no m/r/g. No LE edema. Respiratory: CTA bilaterally, no w/r/r. Normal respiratory effort. Abdomen: soft, ntnd Skin: no rash or induration seen on limited exam Musculoskeletal: grossly normal tone and strength of  BUE/BLE.  No edema noted  Psychiatric: grossly normal mood and affect, speech fluent and appropriate Neurologic: grossly non-focal.           Labs on Admission:  Basic Metabolic Panel:  Recent Labs Lab 06/05/14 2345 06/06/14 0252 06/06/14 0500 06/06/14 1155  NA 120* 125* 131* 137  K 5.4* 4.7 4.6 4.1  CL 78* 83* 90* 96  CO2 24 25 23 25   GLUCOSE 1178* 851* 597* 273*  BUN 32* 32* 31* 29*  CREATININE 1.40* 1.60* 1.70* 1.50*  CALCIUM 10.8* 11.1* 10.8* 10.5   Liver Function Tests:  Recent Labs Lab 06/05/14 2345  AST 31  ALT 54*  ALKPHOS 135*  BILITOT 0.4  PROT 8.6*  ALBUMIN 4.1   No results for input(s): LIPASE, AMYLASE in the  last 168 hours. No results for input(s): AMMONIA in the last 168 hours. CBC:  Recent Labs Lab 06/05/14 2345  WBC 9.0  HGB 15.3  HCT 46.4  MCV 83.3  PLT 154   Cardiac Enzymes: No results for input(s): CKTOTAL, CKMB, CKMBINDEX, TROPONINI in the last 168 hours.  BNP (last 3 results)  Recent Labs  06/06/14 0500  PROBNP 156.9*   CBG:  Recent Labs Lab 06/06/14 0800 06/06/14 0905 06/06/14 1006 06/06/14 1109 06/06/14 1203  GLUCAP 535* 516* 445* 331* 217*    Radiological Exams on Admission: No results found.  EKG: Independently reviewed.   Assessment/Plan Principal Problem:   Hyperosmolar non-ketotic state in patient with type 2 diabetes mellitus Active Problems:   HTN (hypertension)   Morbid obesity   Sleep apnea, on c-pap   Chronic  renal insufficiency, stage II (mild)   Acute on chronic renal failure   Dehydration   Chronic systolic congestive heart failure    HONK state in pt with typer II DM  -Due to undiagnosed DM.  CBGs 09/2013 elevated in 300s. No previous HgA1c -Will eliminate infection/ CAD as enticing event- CXR and cycle troponin pending -Presented with CBGs 1100s- placed on Insulin drip and IV fluids.  CBGs trending downward. -Continue Insulin drip and I V fluids D5 1/2 NS at 5775ml/hr -Will transition to SQ insulin and NS when CBGs allow -BMET q 6 hrs  DM, Type II -Newly diagnosed DM -Hgb A1c pending -Consulted Diabetes Coordinator   Chronic Systolic HF -Appears euvolemic  -2D echo 2013- EF 35-40% with grade I diastolic dysfucntion -BNP elevated at 156.9 -CXR pending -Conservative rehydration with D5 1/2 NS at 9175ml/hr -Will hold Lasix, Aldactone, bumetanide  Acute on Chronic Kidney disease, stage II -Likely due to dehydration in setting of polyuria -Creatinine at 1.50- baseline about 1.3 -repeat BMET in am  Hypertension - BP stable -Continue Coreg 12.5mg  daily. Hold Lasix, Aldactone, Bumetanide  CAD -stable no cp -Will cycle  troponis   Hyponatremic -Resolved- likely secondary to dehydration -Repeat BMET in am  Hx of Ashtma -stable -Continue PRN albuterol  Sleep apnea -Continue CPAP from home, RT to assess settings  Morbid Obesity -BMI 45.5    if consultant consulted, please document name and whether formally or informally consulted  Code Status: Full DVT Prophylaxis: Lovenox Family Communication: Wife at bedside Disposition Plan: Inpatient  Time spent: 2365  Illa LevelOsman, Tanner Yeley [facility] Pager [phone]

## 2014-06-06 NOTE — Plan of Care (Signed)
Problem: Consults Goal: Diabetes Guidelines if Diabetic/Glucose > 140 If diabetic or lab glucose is > 140 mg/dl - Initiate Diabetes/Hyperglycemia Guidelines & Document Interventions  Outcome: Completed/Met Date Met:  06/06/14

## 2014-06-06 NOTE — ED Notes (Signed)
Pt has decided that he will go to [facility]

## 2014-06-06 NOTE — ED Provider Notes (Signed)
Care assumed from Dr. Ranae PalmsYelverton.  I received a call for [facility], who reported that they were unlikely to have a bed available in the near future.  I discussed with patient, who agreed with admission [facility].    Blood sugars continue to improve while on insulin drip. Gap slightly elevated on last check. [facility].  Warnell Foresterrey Jaiveer Panas, MD 06/06/14 [phone]

## 2014-06-06 NOTE — Care Management Note (Addendum)
    Page 1 of 1   06/09/2014     9:25:09 AM CARE MANAGEMENT NOTE 06/09/2014  Patient:  KINGSLY, KLOEPFER   Account Number:  [id number]  Date Initiated:  06/06/2014  Documentation initiated by:  Elissa Hefty  Subjective/Objective Assessment:   adm w hyperglycemia     Action/Plan:   lives at home   Anticipated DC Date:  06/08/2014   Anticipated DC Plan:  Mobile  CM consult  PCP issues      Choice offered to / List presented to:             Status of service:  Completed, signed off Medicare Important Message given?   (If response is "NO", the following Medicare IM given date fields will be blank) Date Medicare IM given:   Medicare IM given by:   Date Additional Medicare IM given:   Additional Medicare IM given by:    Discharge Disposition:  HOME/SELF CARE  Per UR Regulation:  Reviewed for med. necessity/level of care/duration of stay  If discussed at [place] of Stay Meetings, dates discussed:    Comments:  06/10/11 09:00 RN called CM to see if pt's lantus and pens will be covered by Medicaid.  CM explained I cannot confirm coverage on the weekend.  [place], RN states Diabetic coordinator states it IS covered and when googled, confirms coverage.  CM met pt in room and gave pt coupon and handout for a freestyle meter.  Pt verbalizes understanding to call the 1-800 number on the back of his card to get a free meter via the mail.  CM gave pt a resource handout list to secure a PCP.  No other CM needs were communicated.  Mariane Masters, BSN, [place].

## 2014-06-06 NOTE — Plan of Care (Signed)
Problem: Consults Goal: General Medical Patient Education See Patient Education Module for specific education. Outcome: Completed/Met Date Met:  06/06/14     

## 2014-06-06 NOTE — ED Notes (Signed)
[facility]

## 2014-06-06 NOTE — ED Notes (Signed)
Report called to Chat RN on 2900.

## 2014-06-07 DIAGNOSIS — E860 Dehydration: Secondary | ICD-10-CM

## 2014-06-07 LAB — BASIC METABOLIC PANEL
ANION GAP: 13 (ref 5–15)
ANION GAP: 14 (ref 5–15)
Anion gap: 13 (ref 5–15)
BUN: 25 mg/dL — ABNORMAL HIGH (ref 6–23)
BUN: 22 mg/dL (ref 6–23)
BUN: 20 mg/dL (ref 6–23)
CALCIUM: 9.4 mg/dL (ref 8.4–10.5)
CHLORIDE: 100 meq/L (ref 96–112)
CO2: 27 mEq/L (ref 19–32)
CO2: 25 mEq/L (ref 19–32)
CO2: 25 meq/L (ref 19–32)
CREATININE: 1.36 mg/dL — AB (ref 0.50–1.35)
Calcium: 9.2 mg/dL (ref 8.4–10.5)
Calcium: 9.1 mg/dL (ref 8.4–10.5)
Chloride: 99 mEq/L (ref 96–112)
Chloride: 99 mEq/L (ref 96–112)
Creatinine, Ser: 1.57 mg/dL — ABNORMAL HIGH (ref 0.50–1.35)
Creatinine, Ser: 1.43 mg/dL — ABNORMAL HIGH (ref 0.50–1.35)
GFR calc Af Amer: 57 mL/min — ABNORMAL LOW (ref >90)
GFR calc Af Amer: 68 mL/min — ABNORMAL LOW (ref >90)
GFR calc non Af Amer: 55 mL/min — ABNORMAL LOW (ref >90)
GFR calc non Af Amer: 59 mL/min — ABNORMAL LOW (ref >90)
GFR, EST AFRICAN AMERICAN: 64 mL/min — AB (ref >90)
GFR, EST NON AFRICAN AMERICAN: 49 mL/min — AB (ref >90)
Glucose, Bld: 144 mg/dL — ABNORMAL HIGH (ref 70–99)
Glucose, Bld: 131 mg/dL — ABNORMAL HIGH (ref 70–99)
Glucose, Bld: 156 mg/dL — ABNORMAL HIGH (ref 70–99)
POTASSIUM: 4 meq/L (ref 3.7–5.3)
Potassium: 3.5 mEq/L — ABNORMAL LOW (ref 3.7–5.3)
Potassium: 4.4 mEq/L (ref 3.7–5.3)
SODIUM: 139 meq/L (ref 137–147)
SODIUM: 139 meq/L (ref 137–147)
Sodium: 137 mEq/L (ref 137–147)

## 2014-06-07 LAB — GLUCOSE, CAPILLARY
GLUCOSE-CAPILLARY: 152 mg/dL — AB (ref 70–99)
GLUCOSE-CAPILLARY: 161 mg/dL — AB (ref 70–99)
Glucose-Capillary: 152 mg/dL — ABNORMAL HIGH (ref 70–99)
Glucose-Capillary: 160 mg/dL — ABNORMAL HIGH (ref 70–99)
Glucose-Capillary: 160 mg/dL — ABNORMAL HIGH (ref 70–99)
Glucose-Capillary: 167 mg/dL — ABNORMAL HIGH (ref 70–99)
Glucose-Capillary: 168 mg/dL — ABNORMAL HIGH (ref 70–99)
Glucose-Capillary: 128 mg/dL — ABNORMAL HIGH (ref 70–99)
Glucose-Capillary: 173 mg/dL — ABNORMAL HIGH (ref 70–99)
Glucose-Capillary: 191 mg/dL — ABNORMAL HIGH (ref 70–99)
Glucose-Capillary: 341 mg/dL — ABNORMAL HIGH (ref 70–99)
Glucose-Capillary: 333 mg/dL — ABNORMAL HIGH (ref 70–99)

## 2014-06-07 LAB — TROPONIN I: Troponin I: <0.3 ng/mL (ref <0.30)

## 2014-06-07 MED ORDER — INSULIN ASPART 100 UNIT/ML ~~LOC~~ SOLN
0.0000 [IU] | Freq: Every day | SUBCUTANEOUS | Status: DC
  Administered 2014-06-07: 4 [IU] via SUBCUTANEOUS
  Administered 2014-06-08: 2 [IU] via SUBCUTANEOUS

## 2014-06-07 MED ORDER — LISINOPRIL 5 MG PO TABS
5.0000 mg | ORAL_TABLET | Freq: Every day | ORAL | Status: DC
  Administered 2014-06-07 – 2014-06-09 (×3): 5 mg via ORAL
  Filled 2014-06-07 (×3): qty 1

## 2014-06-07 MED ORDER — ATORVASTATIN CALCIUM 40 MG PO TABS
40.0000 mg | ORAL_TABLET | Freq: Every day | ORAL | Status: DC
  Administered 2014-06-07 – 2014-06-09 (×3): 40 mg via ORAL
  Filled 2014-06-07 (×3): qty 1

## 2014-06-07 MED ORDER — LIVING WELL WITH DIABETES BOOK
Freq: Once | Status: AC
  Administered 2014-06-07: 12:00:00
  Filled 2014-06-07: qty 1

## 2014-06-07 MED ORDER — INSULIN ASPART 100 UNIT/ML ~~LOC~~ SOLN
0.0000 [IU] | Freq: Three times a day (TID) | SUBCUTANEOUS | Status: DC
Start: 2014-06-07 — End: 2014-06-09
  Administered 2014-06-07 – 2014-06-09 (×6): 15 [IU] via SUBCUTANEOUS

## 2014-06-07 MED ORDER — POTASSIUM CHLORIDE CRYS ER 20 MEQ PO TBCR
40.0000 meq | EXTENDED_RELEASE_TABLET | Freq: Once | ORAL | Status: AC
  Administered 2014-06-07: 40 meq via ORAL
  Filled 2014-06-07: qty 2

## 2014-06-07 MED ORDER — INSULIN ASPART 100 UNIT/ML ~~LOC~~ SOLN
0.0000 [IU] | Freq: Three times a day (TID) | SUBCUTANEOUS | Status: DC
Start: 2014-06-07 — End: 2014-06-07
  Administered 2014-06-07: 3 [IU] via SUBCUTANEOUS

## 2014-06-07 MED ORDER — INSULIN ASPART 100 UNIT/ML ~~LOC~~ SOLN
4.0000 [IU] | Freq: Three times a day (TID) | SUBCUTANEOUS | Status: DC
  Administered 2014-06-07 – 2014-06-09 (×7): 4 [IU] via SUBCUTANEOUS

## 2014-06-07 MED ORDER — INSULIN ASPART 100 UNIT/ML ~~LOC~~ SOLN: 0.0000 [IU] | Freq: Every day | SUBCUTANEOUS | Status: DC

## 2014-06-07 MED ORDER — INSULIN GLARGINE 100 UNIT/ML ~~LOC~~ SOLN
20.0000 [IU] | Freq: Every morning | SUBCUTANEOUS | Status: DC
  Administered 2014-06-07: 20 [IU] via SUBCUTANEOUS
  Filled 2014-06-07 (×2): qty 0.2

## 2014-06-07 NOTE — Progress Notes (Signed)
*  PRELIMINARY RESULTS* Echocardiogram 2D Echocardiogram has been performed.  Jeryl ColumbiaLLIOTT, Chenae Brager 06/07/2014, 9:27 AM

## 2014-06-07 NOTE — Progress Notes (Signed)
Dr Janee Mornhompson called regarding pt CBG of 341. Per Dr Janee Mornhompson, pt will be placed on a resistant scale and continue 4 units meal coverage. Pt not to be placed back on Glucose Stabilizer at this time. New orders implemented

## 2014-06-07 NOTE — Plan of Care (Signed)
Problem: Food- and Nutrition-Related Knowledge Deficit (NB-1.1) Goal: Nutrition education Formal process to instruct or train a patient/client in a skill or to impart knowledge to help patients/clients voluntarily manage or modify food choices and eating behavior to maintain or improve health. Outcome: Completed/Met Date Met:  06/07/14  RD consulted for nutrition education regarding diabetes.     Lab Results  Component Value Date    HGBA1C 13.2* 06/06/2014    RD provided "Carbohydrate Counting for People with Diabetes" handout from the Academy of Nutrition and Dietetics. Discussed different food groups and their effects on blood sugar, emphasizing carbohydrate-containing foods. Provided list of carbohydrates and recommended serving sizes of common foods.  Discussed importance of controlled and consistent carbohydrate intake throughout the day. Provided examples of ways to balance meals/snacks and encouraged intake of high-fiber, whole grain complex carbohydrates. Teach back method used.  Expect good compliance.  Body mass index is 45.43 kg/(m^2). Pt meets criteria for morbid obesity based on current BMI.  Current diet order is carbohydrate modified, patient is consuming approximately 100% of meals at this time. Labs and medications reviewed. No further nutrition interventions warranted at this time. RD contact information provided. If additional nutrition issues arise, please re-consult RD.  Laurette Schimke MS, RD, LDN

## 2014-06-07 NOTE — Progress Notes (Signed)
[facility] PROGRESS NOTE  Joel Silva VEH:[id number] DOB: Aug 05, 1960 DOA: 06/05/2014 PCP: No primary care provider on file.  Assessment/Plan: #1 hyperosmolar nonketotic hyperglycemia/new onset diabetes mellitus type 2 Likely secondary to new onset type 2 diabetes mellitus. No source of infection. Urinalysis is negative. Chest x-rays negative. Cardiac enzymes negative 3. Hemoglobin A1c is 13.2. Patient with for CBGs less than 200. Will transition patient off the insulin drip to subcutaneous Lantus at 20 units daily. Once insulin drip has been discontinued will change IV fluids from D5 normal saline to normal saline. Place on a sliding scale insulin. One strip has been turned off would advance diet to a copy modified diet. Patient was started kit. Consult with diabetic coordinator.  #2 dehydration IV fluids.  #3 chronic systolic heart failure/coronary artery disease Compensated. Cardiac enzymes negative 3. 2-D echo from 2013 with a EF of 35-40% with grade 1 diastolic dysfunction. Repeat 2-D echo pending. Chest x-ray negative for volume overload. We'll continue to hold Lasix and Aldactone and Bumex for now. Needs outpatient follow-up.  #4 acute on chronic kidney disease stage II Likely prerenal azotemia in the setting of diuretics. Currently close to baseline. Follow. Monitor closely with hydration.  #5 hypertension Stable. Continue Coreg. Continue to hold Lasix Aldactone and Bumex for now.  #6 hyponatremia Secondary to problem #1. Resolved.  #7 history of asthma Stable. Albuterol as needed.  #8 obstructive sleep apnea C Pap daily at bedtime.   #9 morbid obesity BMI of 45.5.  #10 prophylaxis Low-dose for DVT prophylaxis. Protonix for GI prophylaxis.   #10  Code Status: Full Family Communication: Updated patient no family present. Disposition Plan: Remain in stepdown.   Consultants:  None  Procedures:  Chest x-ray  06/06/2014  Antibiotics:  None  HPI/Subjective: Patient with no complaints. No CP, no SOB. Feeling better but weak.  Objective: Filed Vitals:   06/07/14 0800  BP: 128/91  Pulse: 78  Temp:   Resp: 12    Intake/Output Summary (Last 24 hours) at 06/07/14 0846 Last data filed at 06/07/14 0800  Gross per 24 hour  Intake 1474.39 ml  Output   1000 ml  Net 474.39 ml   Filed Weights   06/05/14 2336 06/06/14 1345  Weight: 136.079 kg (300 lb) 127.6 kg (281 lb 4.9 oz)    Exam:   General:  NAD  Cardiovascular: RRR. Distant HS secondary to body habitus  Respiratory: CTAB  Abdomen: Soft/NT/ND/+BS  Musculoskeletal: No c/c/e  Data Reviewed: Basic Metabolic Panel:  Recent Labs Lab 06/06/14 1155 06/06/14 1909 06/06/14 2225 06/07/14 0230 06/07/14 0730  NA 137 138 136* 139 139  K 4.1 3.9 3.6* 3.5* 4.0  CL 96 99 96 99 100  CO2 $Re'25 24 24 27 25  'Azp$ GLUCOSE 273* 172* 125* 144* 131*  BUN 29* 26* 24* 25* 22  CREATININE 1.50* 1.51* 1.46* 1.57* 1.43*  CALCIUM 10.5 9.5 9.5 9.4 9.2  MG  --  2.3  --   --   --    Liver Function Tests:  Recent Labs Lab 06/05/14 2345  AST 31  ALT 54*  ALKPHOS 135*  BILITOT 0.4  PROT 8.6*  ALBUMIN 4.1   No results for input(s): LIPASE, AMYLASE in the last 168 hours. No results for input(s): AMMONIA in the last 168 hours. CBC:  Recent Labs Lab 06/05/14 2345  WBC 9.0  HGB 15.3  HCT 46.4  MCV 83.3  PLT 154   Cardiac Enzymes:  Recent Labs Lab 06/06/14 1909 06/07/14 0230  TROPONINI <  0.30 <0.30   BNP (last 3 results)  Recent Labs  06/06/14 0500  PROBNP 156.9*   CBG:  Recent Labs Lab 06/07/14 0232 06/07/14 0335 06/07/14 0438 06/07/14 0536 06/07/14 0734  GLUCAP 152* 168* 160* 160* 128*    Recent Results (from the past 240 hour(s))  MRSA PCR Screening     Status: None   Collection Time: 06/06/14  1:53 PM  Result Value Ref Range Status   MRSA by PCR NEGATIVE NEGATIVE Final    Comment:        The GeneXpert MRSA  Assay (FDA approved for NASAL specimens only), is one component of a comprehensive MRSA colonization surveillance program. It is not intended to diagnose MRSA infection nor to guide or monitor treatment for MRSA infections.      Studies: Portable Chest X-ray (1 View)  06/06/2014   CLINICAL DATA:  Initial evaluation for weakness and mild shortness of breath, personal history of congestive heart failure  EXAM: PORTABLE CHEST - 1 VIEW  COMPARISON:  04/15/2013  FINDINGS: Moderate cardiac enlargement stable. Vascular pattern normal. Lungs clear.  IMPRESSION: Stable moderate cardiac enlargement with no acute findings   Electronically Signed   By: Skipper Cliche M.D.   On: 06/06/2014 15:52    Scheduled Meds: . carvedilol  12.5 mg Oral BID WC  . enoxaparin (LOVENOX) injection  40 mg Subcutaneous Q24H  . insulin glargine  20 Units Subcutaneous q morning - 10a  . pantoprazole  40 mg Oral Daily   Continuous Infusions: . sodium chloride Stopped (06/06/14 1500)  . dextrose 5 % and 0.45% NaCl    . dextrose 5 % and 0.45% NaCl 75 mL/hr at 06/06/14 1900  . insulin (NOVOLIN-R) infusion 1 mL/hr at 06/06/14 1330  . insulin (NOVOLIN-R) infusion 4.5 Units/hr (06/07/14 0630)    Principal Problem:   Hyperosmolar non-ketotic state in patient with type 2 diabetes mellitus Active Problems:   HTN (hypertension)   Morbid obesity   Sleep apnea, on c-pap   Chronic renal insufficiency, stage II (mild)   Acute on chronic renal failure   Dehydration   Chronic systolic congestive heart failure   DM (diabetes mellitus), type 2    Time spent: 40 mins    [facility] MD [facility] Pager [phone]. If 7PM-7AM, please contact night-coverage at [email or web address], password [facility] 06/07/2014, 8:46 AM  LOS: 2 days

## 2014-06-07 NOTE — Plan of Care (Signed)
Problem: Phase I Progression Outcomes Goal: Pain controlled with appropriate interventions Outcome: Completed/Met Date Met:  06/07/14 Goal: OOB as tolerated unless otherwise ordered Outcome: Completed/Met Date Met:  06/07/14 Goal: Initial discharge plan identified Outcome: Completed/Met Date Met:  06/07/14 Goal: Voiding-avoid urinary catheter unless indicated Outcome: Not Applicable Date Met:  16/57/90 Goal: Hemodynamically stable Outcome: Not Applicable Date Met:  38/33/38 Goal: Other Phase I Outcomes/Goals Outcome: Not Applicable Date Met:  32/91/91

## 2014-06-08 LAB — CBC
HEMATOCRIT: 41.6 % (ref 39.0–52.0)
Hemoglobin: 13.9 g/dL (ref 13.0–17.0)
MCH: 27.5 pg (ref 26.0–34.0)
MCHC: 33.4 g/dL (ref 30.0–36.0)
MCV: 82.2 fL (ref 78.0–100.0)
Platelets: 166 10*3/uL (ref 150–400)
RBC: 5.06 MIL/uL (ref 4.22–5.81)
RDW: 12.8 % (ref 11.5–15.5)
WBC: 7 10*3/uL (ref 4.0–10.5)

## 2014-06-08 LAB — LIPID PANEL
Cholesterol: 111 mg/dL (ref 0–200)
HDL: 37 mg/dL — AB (ref >39)
LDL CALC: 43 mg/dL (ref 0–99)
Total CHOL/HDL Ratio: 3 RATIO
Triglycerides: 153 mg/dL — ABNORMAL HIGH (ref <150)
VLDL: 31 mg/dL (ref 0–40)

## 2014-06-08 LAB — BASIC METABOLIC PANEL
Anion gap: 13 (ref 5–15)
BUN: 18 mg/dL (ref 6–23)
CO2: 24 meq/L (ref 19–32)
CREATININE: 1.3 mg/dL (ref 0.50–1.35)
Calcium: 8.9 mg/dL (ref 8.4–10.5)
Chloride: 98 mEq/L (ref 96–112)
GFR calc Af Amer: 72 mL/min — ABNORMAL LOW (ref >90)
GFR calc non Af Amer: 62 mL/min — ABNORMAL LOW (ref >90)
GLUCOSE: 264 mg/dL — AB (ref 70–99)
Potassium: 3.8 mEq/L (ref 3.7–5.3)
Sodium: 135 mEq/L — ABNORMAL LOW (ref 137–147)

## 2014-06-08 LAB — GLUCOSE, CAPILLARY
GLUCOSE-CAPILLARY: 322 mg/dL — AB (ref 70–99)
GLUCOSE-CAPILLARY: 229 mg/dL — AB (ref 70–99)
Glucose-Capillary: 330 mg/dL — ABNORMAL HIGH (ref 70–99)
Glucose-Capillary: 340 mg/dL — ABNORMAL HIGH (ref 70–99)

## 2014-06-08 MED ORDER — INSULIN GLARGINE 100 UNIT/ML ~~LOC~~ SOLN
26.0000 [IU] | Freq: Every morning | SUBCUTANEOUS | Status: DC
  Filled 2014-06-08: qty 0.26

## 2014-06-08 MED ORDER — BLOOD GLUCOSE METER KIT: PACK | Status: DC

## 2014-06-08 MED ORDER — INSULIN ASPART 100 UNIT/ML FLEXPEN: 6.0000 [IU] | PEN_INJECTOR | Freq: Three times a day (TID) | SUBCUTANEOUS | Status: DC

## 2014-06-08 MED ORDER — INSULIN PEN NEEDLE 31G X 5 MM MISC: 6.0000 [IU] | Freq: Three times a day (TID) | Status: DC

## 2014-06-08 MED ORDER — INSULIN STARTER KIT- SYRINGES (ENGLISH)
1.0000 | Freq: Once | Status: AC
  Administered 2014-06-08: 1
  Filled 2014-06-08: qty 1

## 2014-06-08 MED ORDER — INSULIN GLARGINE 100 UNIT/ML SOLOSTAR PEN: 38.0000 [IU] | PEN_INJECTOR | Freq: Every day | SUBCUTANEOUS | Status: DC

## 2014-06-08 MED ORDER — INSULIN STARTER KIT- PEN NEEDLES (ENGLISH)
1.0000 | Freq: Once | Status: AC
  Administered 2014-06-08: 1
  Filled 2014-06-08: qty 1

## 2014-06-08 MED ORDER — INSULIN GLARGINE 100 UNIT/ML ~~LOC~~ SOLN
30.0000 [IU] | Freq: Every morning | SUBCUTANEOUS | Status: DC
  Administered 2014-06-08: 30 [IU] via SUBCUTANEOUS
  Filled 2014-06-08: qty 0.3

## 2014-06-08 MED ORDER — INSULIN GLARGINE 100 UNIT/ML ~~LOC~~ SOLN
38.0000 [IU] | Freq: Every morning | SUBCUTANEOUS | Status: DC
  Filled 2014-06-08: qty 0.38

## 2014-06-08 MED ORDER — INSULIN GLARGINE 100 UNIT/ML ~~LOC~~ SOLN
8.0000 [IU] | Freq: Once | SUBCUTANEOUS | Status: AC
  Administered 2014-06-08: 8 [IU] via SUBCUTANEOUS
  Filled 2014-06-08: qty 0.08

## 2014-06-08 NOTE — Progress Notes (Signed)
[facility] PROGRESS NOTE  Joel Silva WUX:[id number] DOB: [birth year] DOA: 06/05/2014 PCP: No primary care provider on file.  Assessment/Plan: #1 hyperosmolar nonketotic hyperglycemia/new onset diabetes mellitus type 2 Likely secondary to new onset type 2 diabetes mellitus. No source of infection. Urinalysis is negative. Chest x-rays negative. Cardiac enzymes negative 3. Hemoglobin A1c is 13.2. Patient with for CBGs less than 200. Patient off glucose stabilizer. CBGs still elevated in 300s. Will increase lantus to 30 units daily. Continue meal coverage insulin and SSI. Continue carb modified diet. Insulin starter kit. Diabetic coordinator ff.  #2 dehydration Resolved. NSL IVF.  #3 chronic systolic heart failure/coronary artery disease/NICM Compensated. Cardiac enzymes negative 3. 2-D echo from yesterday with mildly worsened EF of 20-25% with diffuse hypokineses when compared to 2 d echo of 2013 with a EF of 35-40% with grade 1 diastolic dysfunction. Patient asymptomatic. Chest x-ray negative for volume overload. Resumed lisinopril at lower dose yesterday. NSL ICF. Will resume aldactone and lasix at lower dose in 1-2 days if BP withstands. Wuill need to f/u with his cardiologist as outpatient for discussions on ICD placement. Curbsided cardiology. Outpatient cardiology f/u.  #4 acute on chronic kidney disease stage II Likely prerenal azotemia in the setting of diuretics. Currently close to baseline.  #5 hypertension Stable. Continue Coreg. Resumed lisinopril at lower dose. Continue to hold Lasix, Aldactone and Bumex for now.  #6 hyponatremia Secondary to problem #1. Resolved.  #7 history of asthma Stable. Albuterol as needed.  #8 obstructive sleep apnea C Pap daily at bedtime.   #9 morbid obesity BMI of 45.5.  #10 prophylaxis Low-dose for DVT prophylaxis. Protonix for GI prophylaxis.    Code Status: Full Family Communication: Updated patient no family  present. Disposition Plan: transfer to telemetry   Consultants:  None  Procedures:  Chest x-ray 06/06/2014  2 d echo 06/07/14  Antibiotics:  None  HPI/Subjective: Patient with no complaints. No CP, no SOB. Feeling better but weak.  Objective: Filed Vitals:   06/08/14 0816  BP: 126/91  Pulse:   Temp: 97.5 F (36.4 C)  Resp: 18    Intake/Output Summary (Last 24 hours) at 06/08/14 0831 Last data filed at 06/07/14 1900  Gross per 24 hour  Intake   1359 ml  Output      0 ml  Net   1359 ml   Filed Weights   06/05/14 2336 06/06/14 1345  Weight: 136.079 kg (300 lb) 127.6 kg (281 lb 4.9 oz)    Exam:   General:  NAD  Cardiovascular: RRR. Distant HS secondary to body habitus  Respiratory: CTAB  Abdomen: Soft/NT/ND/+BS  Musculoskeletal: No c/c/e  Data Reviewed: Basic Metabolic Panel:  Recent Labs Lab 06/06/14 1909 06/06/14 2225 06/07/14 0230 06/07/14 0730 06/07/14 1148 06/08/14 0326  NA 138 136* 139 139 137 135*  K 3.9 3.6* 3.5* 4.0 4.4 3.8  CL 99 96 99 100 99 98  CO2 $Re'24 24 27 25 25 24  'DdJ$ GLUCOSE 172* 125* 144* 131* 156* 264*  BUN 26* 24* 25* $Remov'22 20 18  'vDzrnJ$ CREATININE 1.51* 1.46* 1.57* 1.43* 1.36* 1.30  CALCIUM 9.5 9.5 9.4 9.2 9.1 8.9  MG 2.3  --   --   --   --   --    Liver Function Tests:  Recent Labs Lab 06/05/14 2345  AST 31  ALT 54*  ALKPHOS 135*  BILITOT 0.4  PROT 8.6*  ALBUMIN 4.1   No results for input(s): LIPASE, AMYLASE in the last 168 hours. No results for  input(s): AMMONIA in the last 168 hours. CBC:  Recent Labs Lab 06/05/14 2345 06/08/14 0326  WBC 9.0 7.0  HGB 15.3 13.9  HCT 46.4 41.6  MCV 83.3 82.2  PLT 154 166   Cardiac Enzymes:  Recent Labs Lab 06/06/14 1909 06/07/14 0230  TROPONINI <0.30 <0.30   BNP (last 3 results)  Recent Labs  06/06/14 0500  PROBNP 156.9*   CBG:  Recent Labs Lab 06/07/14 0636 06/07/14 0734 06/07/14 1141 06/07/14 1709 06/07/14 2147  GLUCAP 173* 128* 191* 341* 333*     Recent Results (from the past 240 hour(s))  MRSA PCR Screening     Status: None   Collection Time: 06/06/14  1:53 PM  Result Value Ref Range Status   MRSA by PCR NEGATIVE NEGATIVE Final    Comment:        The GeneXpert MRSA Assay (FDA approved for NASAL specimens only), is one component of a comprehensive MRSA colonization surveillance program. It is not intended to diagnose MRSA infection nor to guide or monitor treatment for MRSA infections.      Studies: Portable Chest X-ray (1 View)  06/06/2014   CLINICAL DATA:  Initial evaluation for weakness and mild shortness of breath, personal history of congestive heart failure  EXAM: PORTABLE CHEST - 1 VIEW  COMPARISON:  04/15/2013  FINDINGS: Moderate cardiac enlargement stable. Vascular pattern normal. Lungs clear.  IMPRESSION: Stable moderate cardiac enlargement with no acute findings   Electronically Signed   By: Skipper Cliche M.D.   On: 06/06/2014 15:52    Scheduled Meds: . atorvastatin  40 mg Oral Daily  . carvedilol  12.5 mg Oral BID WC  . enoxaparin (LOVENOX) injection  40 mg Subcutaneous Q24H  . insulin aspart  0-20 Units Subcutaneous TID WC  . insulin aspart  0-5 Units Subcutaneous QHS  . insulin aspart  4 Units Subcutaneous TID WC  . insulin glargine  30 Units Subcutaneous q morning - 10a  . lisinopril  5 mg Oral Daily  . pantoprazole  40 mg Oral Daily   Continuous Infusions: . insulin (NOVOLIN-R) infusion 1 mL/hr at 06/06/14 1330  . insulin (NOVOLIN-R) infusion 4.5 Units/hr (06/07/14 0630)    Principal Problem:   Hyperosmolar non-ketotic state in patient with type 2 diabetes mellitus Active Problems:   HTN (hypertension)   Morbid obesity   Sleep apnea, on c-pap   Chronic renal insufficiency, stage II (mild)   Acute on chronic renal failure   Dehydration   Chronic systolic congestive heart failure   DM (diabetes mellitus), type 2    Time spent: 40 mins    [facility] MD [facility] Pager [phone]. If 7PM-7AM, please contact night-coverage at [email or web address], password [facility] 06/08/2014, 8:31 AM  LOS: 3 days

## 2014-06-08 NOTE — Progress Notes (Signed)
OT Discharge Note  Patient Details Name: Joel QuillJonathan Silva MRN: [id number] DOB: 01/04/1961   Cancelled Treatment:    Reason Eval/Treat Not Completed: OT screened, no needs identified, will sign off. PT Cary reports no acute OT needs at this time.  Harolyn RutherfordJones, Carney Saxton B  Pager: [phone]  06/08/2014, 10:47 AM

## 2014-06-08 NOTE — Progress Notes (Signed)
Inpatient Diabetes Program Recommendations  AACE/ADA: New Consensus Statement on Inpatient Glycemic Control (2013)  Target Ranges:  Prepandial:   less than 140 mg/dL      Peak postprandial:   less than 180 mg/dL (1-2 hours)      Critically ill patients:  140 - 180 mg/dL   Reason for Visit: admit with HHNK. New-onset DM.  Inpatient Diabetes Program Recommendations Oral Agents: add Metformin 500 mg if not contraindicated  Note: This coordinator met with patient and spouse to discuss diabetes basics and instruct on insulin administration using an insulin pen.  Pt was able to demonstrate using teach back method.  Discussed s/s of hypo and hyperglycemia and 15/15 rule. Pt will need a RX for glucose meter and supplies at discharge. Discussed basic carb counting and gave pt a Diabetes Meal Planning Guide. Encouraged pt to view the diabetes videos [phone]) on the patient ed network.  Pt has Living Well with Diabetes patient ed workbook at bedside.  Pt needs to demonstrate checking a CBG prior to discharge.  No further questions/concerns at the end of our visit. Thank you  Raoul Pitch BSN, RN,CDE Inpatient Diabetes Coordinator [phone] (team pager)

## 2014-06-08 NOTE — Evaluation (Signed)
Physical Therapy Evaluation Patient Details Name: Joel QuillJonathan Silva MRN: [id number] DOB: 01/02/1961 Today's Date: 06/08/2014   History of Present Illness  Pt adm with hyperosmolar nonketotic hyperglycemia/new onset diabetes mellitus type 2.  Clinical Impression  Pt doing well with mobility and no further PT needed.  Ready for dc from PT standpoint.      Follow Up Recommendations No PT follow up    Equipment Recommendations  None recommended by PT    Recommendations for Other Services       Precautions / Restrictions Precautions Precautions: None      Mobility  Bed Mobility                  Transfers Overall transfer level: Independent                  Ambulation/Gait Ambulation/Gait assistance: Independent   Assistive device: None Gait Pattern/deviations: WFL(Within Functional Limits)   Gait velocity interpretation: at or above normal speed for age/gender    Stairs            Wheelchair Mobility    Modified Rankin (Stroke Patients Only)       Balance Overall balance assessment: No apparent balance deficits (not formally assessed)                                           Pertinent Vitals/Pain Pain Assessment: No/denies pain    Home Living Family/patient expects to be discharged to:: Private residence Living Arrangements: Spouse/significant other;Children             Home Equipment: None      Prior Function Level of Independence: Independent               Hand Dominance        Extremity/Trunk Assessment   Upper Extremity Assessment: Overall WFL for tasks assessed           Lower Extremity Assessment: Overall WFL for tasks assessed         Communication   Communication: No difficulties  Cognition Arousal/Alertness: Awake/alert Behavior During Therapy: WFL for tasks assessed/performed Overall Cognitive Status: Within Functional Limits for tasks assessed                      General Comments      Exercises        Assessment/Plan    PT Assessment Patent does not need any further PT services  PT Diagnosis Difficulty walking   PT Problem List    PT Treatment Interventions     PT Goals (Current goals can be found in the Care Plan section) Acute Rehab PT Goals PT Goal Formulation: All assessment and education complete, DC therapy    Frequency     Barriers to discharge        Co-evaluation               End of Session   Activity Tolerance: Patient tolerated treatment well Patient left: in chair;with call bell/phone within reach;with family/visitor present Nurse Communication: Mobility status         Time: 1030-1035 PT Time Calculation (min) (ACUTE ONLY): 5 min   Charges:   PT Evaluation $Initial PT Evaluation Tier I: 1 Procedure     PT G Codes:          Talik Casique 06/08/2014, 10:42 AM  Skip Mayerary Janika Jedlicka PT [phone]

## 2014-06-08 NOTE — Progress Notes (Signed)
Pt administered own insulin shot for noon coverage. Pt demonstrated well and denies further questions at this time. Diabetes coordinator to come and talk with pt and wife.

## 2014-06-08 NOTE — Progress Notes (Signed)
Pt and wife taught how to do Insulin injections. Pt's wife demonstrated well. Basic diabetes teaching completed. Pt refuses to watch videos at this time because he feels "overwhelmed" but did accept the diabetes teaching book. Pt is currently reading the materials. Pt denies further questions at this time.

## 2014-06-08 NOTE — Progress Notes (Addendum)
Report given to Joel Silva on [address]. Pt and wife aware of room assignment and all belongings with pt.

## 2014-06-09 DIAGNOSIS — G4730 Sleep apnea, unspecified: Secondary | ICD-10-CM

## 2014-06-09 LAB — BASIC METABOLIC PANEL
ANION GAP: 11 (ref 5–15)
BUN: 17 mg/dL (ref 6–23)
CHLORIDE: 100 meq/L (ref 96–112)
CO2: 27 meq/L (ref 19–32)
Calcium: 9.1 mg/dL (ref 8.4–10.5)
Creatinine, Ser: 1.2 mg/dL (ref 0.50–1.35)
GFR calc Af Amer: 79 mL/min — ABNORMAL LOW (ref >90)
GFR calc non Af Amer: 68 mL/min — ABNORMAL LOW (ref >90)
GLUCOSE: 252 mg/dL — AB (ref 70–99)
POTASSIUM: 5 meq/L (ref 3.7–5.3)
SODIUM: 138 meq/L (ref 137–147)

## 2014-06-09 LAB — GLUCOSE, CAPILLARY
Glucose-Capillary: 268 mg/dL — ABNORMAL HIGH (ref 70–99)
Glucose-Capillary: 320 mg/dL — ABNORMAL HIGH (ref 70–99)
Glucose-Capillary: 383 mg/dL — ABNORMAL HIGH (ref 70–99)

## 2014-06-09 LAB — CBC
HCT: 40.5 % (ref 39.0–52.0)
HEMOGLOBIN: 13.3 g/dL (ref 13.0–17.0)
MCH: 27.3 pg (ref 26.0–34.0)
MCHC: 32.8 g/dL (ref 30.0–36.0)
MCV: 83.2 fL (ref 78.0–100.0)
Platelets: 154 10*3/uL (ref 150–400)
RBC: 4.87 MIL/uL (ref 4.22–5.81)
RDW: 12.8 % (ref 11.5–15.5)
WBC: 4.9 10*3/uL (ref 4.0–10.5)

## 2014-06-09 MED ORDER — INSULIN PEN NEEDLE 31G X 5 MM MISC: 6.0000 [IU] | Freq: Three times a day (TID) | Status: AC

## 2014-06-09 MED ORDER — INSULIN ASPART 100 UNIT/ML FLEXPEN: 6.0000 [IU] | PEN_INJECTOR | Freq: Three times a day (TID) | SUBCUTANEOUS | Status: AC

## 2014-06-09 MED ORDER — BLOOD GLUCOSE METER KIT: PACK | Status: AC

## 2014-06-09 MED ORDER — INSULIN GLARGINE 100 UNIT/ML SOLOSTAR PEN: 42.0000 [IU] | PEN_INJECTOR | Freq: Every day | SUBCUTANEOUS | Status: AC

## 2014-06-09 MED ORDER — INSULIN GLARGINE 100 UNIT/ML ~~LOC~~ SOLN
42.0000 [IU] | Freq: Every morning | SUBCUTANEOUS | Status: DC
  Administered 2014-06-09: 42 [IU] via SUBCUTANEOUS
  Filled 2014-06-09: qty 0.42

## 2014-06-09 NOTE — Discharge Summary (Addendum)
Physician Discharge Summary  Kairos Panetta ARS:[id number] DOB: [birth year] DOA: 06/05/2014  PCP: Iona Hansen, NP  Admit date: 06/05/2014 Discharge date: 06/09/2014  Time spent: 70 minutes  Recommendations for Outpatient Follow-up:  1. Follow-up with Iona Hansen, NP in 1 week. All follow-up patient's new onset diabetes mellitus only to be reassessed the patient has been discharged on insulin. Patient will also need a basic metabolic profile done to follow-up on electrolytes and renal function. 2. Follow-up with cardiology, Diego Cory, NP for follow-up on his nonischemic cardiomyopathy and chronic systolic heart failure. Patient's repeat 2-D echo had a worsening ejection fraction of 20-25% and patient will likely need to be considered for ICD placement. Patient's cardiac medications will need to be reassessed.  Discharge Diagnoses:  Principal Problem:   Hyperosmolar non-ketotic state in patient with type 2 diabetes mellitus Active Problems:   HTN (hypertension)   Morbid obesity   Sleep apnea, on c-pap   Chronic renal insufficiency, stage II (mild)   Acute on chronic renal failure   Dehydration   Chronic systolic congestive heart failure   DM (diabetes mellitus), type 2   Discharge Condition: Stable and improved  Diet recommendation: Carb modified/heart healthy  Filed Weights   06/06/14 1345 06/08/14 1643 06/08/14 2111  Weight: 127.6 kg (281 lb 4.9 oz) 127 kg (279 lb 15.8 oz) 127.8 kg (281 lb 12 oz)    History of present illness:  Joel Silva is a 53 y.o. male with PMH of Chronic systolic CHF (2D Echo 2013- EF 35-40%), CKD, stage II, CAD, HTN, Sleep apnea (CPAP at home), and Morbid obesity that presented to [facility] on 12[facility]1 with increased thirst and generalized weakness for the past two weeks. He was hyperglycemic with CBGs in 1100s, without evidence of ketones. He was placed on Insulin drip and given IV fluids. Patient subsequently developed leg cramping and was given  Ativan and morphine. He was then instructed to come to [facility] for further management. Patient stated he was drinking gallons of water due to polydipsia and subsequently has had increased urination and polyphagia. He denied any fever, chills, chest pain, sob, nausea, vomiting, abd pain, dysuria, diarrhea, or leg swelling. He denied recent illness or any changes in medications. Pt works as a Naval architect, and admits to not eating good diet or exercising regularly. He denied any known history of diabetes, and stated he recently had physical exam with PCP  Jones at [facility], with no mention of diabetes. However, after review of his records, his CBGs on March were elevated in 200-300s, but there is no evidence of follow up Hgb A1C.   His family history is positive for diabetes in mother. He denied any smoking, drinks 2-3 beers/month, and denied illict drug use.    Hospital Course:  #1 hyperosmolar nonketotic hyperglycemia/new onset diabetes mellitus type 2 Patient was admitted with hyperosmolar nonketotic hyperglycemia with initial CBGs and 1100s. Urinalysis which was done was negative for ketones. Patient was not acidotic. Likely secondary to new onset type 2 diabetes mellitus. No source of infection. Urinalysis was negative. Chest x-rays negative. Cardiac enzymes negative 3. Hemoglobin A1c is 13.2. Patient was initially placed on insulin drip/glucose stabilizer. Was also placed on gentle IV fluid hydration. Patient's blood sugars improved and he was transitioned to subcutaneous Lantus. Lantus doses were adjusted to 42 units daily. Patient was also placed in a sliding scale insulin. Patient was placed on a carb modified diet. Patient will be discharged on Lantus as well as meal coverage  insulin. Patient will follow-up with PCP as outpatient one week post discharge for further management of his new-onset type 2 diabetes. Patient will be discharged in stable and improved condition.   #2  dehydration Resolved.  #3 chronic systolic heart failure/coronary artery disease/NICM Compensated. Cardiac enzymes negative 3. 2-D echo with mildly worsened EF of 20-25% with diffuse hypokineses when compared to 2 d echo of 2013 with a EF of 35-40% with grade 1 diastolic dysfunction. Patient asymptomatic. Chest x-ray negative for volume overload. Resumed lisinopril at lower dose. Bumex and aldactone will be resumed on discharge. Will need to f/u with his cardiologist as outpatient for discussions on ICD placement. Curbsided cardiology. Outpatient cardiology f/u.  #4 acute on chronic kidney disease stage II Likely prerenal azotemia in the setting of diuretics. Improved and was back to baseline by day of discharge.  #5 hypertension Stable. Continued Coreg and lisinopril during the hospitalization. Bumex and aldactone to be resumed on discharge.  #6 hyponatremia Secondary to problem #1. Resolved.  #7 history of asthma Stable. Albuterol as needed.  #8 obstructive sleep apnea CPAP daily at bedtime.   #9 morbid obesity BMI of 45.5.  Procedures:  Chest x-ray 06/06/2014  2 d echo 06/07/14  Consultations:  None  Discharge Exam: Filed Vitals:   06/09/14 1632  BP: 131/74  Pulse: 69  Temp: 97.5 F (36.4 C)  Resp: 20    General: NAD Cardiovascular: RRR Respiratory: CTAB  Discharge Instructions You were cared for by a hospitalist during your hospital stay. If you have any questions about your discharge medications or the care you received while you were in the hospital after you are discharged, you can call the unit and asked to speak with the hospitalist on call if the hospitalist that took care of you is not available. Once you are discharged, your primary care physician will handle any further medical issues. Please note that NO REFILLS for any discharge medications will be authorized once you are discharged, as it is imperative that you return to your primary care physician (or  establish a relationship with a primary care physician if you do not have one) for your aftercare needs so that they can reassess your need for medications and monitor your lab values.  Discharge Instructions    Diet - low sodium heart healthy    Complete by:  As directed      Diet Carb Modified    Complete by:  As directed      Discharge instructions    Complete by:  As directed   Follow up with Riley Nearing, NP, cardiology in 1 week. Follow up Berkley Harvey, NP in 1 week. Check blood sugars 4 times daily and record to take to PCP.     Increase activity slowly    Complete by:  As directed           Current Discharge Medication List    START taking these medications   Details  Blood Glucose Monitoring Suppl (BLOOD GLUCOSE METER KIT AND SUPPLIES) Dispense based on patient and insurance preference. Use up to four times daily as directed. (FOR ICD-9 250.00, 250.01). Qty: 1 each, Refills: 0    insulin aspart (NOVOLOG) 100 UNIT/ML FlexPen Inject 6 Units into the skin 3 (three) times daily with meals. Qty: 15 mL, Refills: 0    Insulin Glargine (LANTUS SOLOSTAR) 100 UNIT/ML Solostar Pen Inject 42 Units into the skin daily at 10 pm. Qty: 15 mL, Refills: 0    Insulin Pen  Needle 31G X 5 MM MISC 6-42 Units by Does not apply route 4 (four) times daily -  before meals and at bedtime. Qty: 100 each, Refills: 0      CONTINUE these medications which have NOT CHANGED   Details  albuterol (PROVENTIL HFA;VENTOLIN HFA) 108 (90 BASE) MCG/ACT inhaler Inhale 2 puffs into the lungs every 6 (six) hours as needed for wheezing.    atorvastatin (LIPITOR) 40 MG tablet Take 40 mg by mouth daily.    bumetanide (BUMEX) 2 MG tablet Take 2 mg by mouth daily.    carvedilol (COREG) 12.5 MG tablet Take 12.5 mg by mouth 2 (two) times daily with a meal.    lisinopril (PRINIVIL,ZESTRIL) 5 MG tablet Take 5 mg by mouth daily.    spironolactone (ALDACTONE) 25 MG tablet Take 1 tablet by mouth daily. PLEASE  SCHEDULE OFFICE VISIT. Qty: 30 tablet, Refills: 1      STOP taking these medications     furosemide (LASIX) 40 MG tablet        No Known Allergies Follow-up Information    Follow up with Berkley Harvey, NP. Schedule an appointment as soon as possible for a visit in 1 week.   Specialty:  Nurse Practitioner   Contact information:   [address] [place] [address] [phone]       Follow up with Tye Savoy. Schedule an appointment as soon as possible for a visit in 1 week.   Contact information:   [address] [facility] [place] [address] [phone]        The results of significant diagnostics from this hospitalization (including imaging, microbiology, ancillary and laboratory) are listed below for reference.    Significant Diagnostic Studies: Portable Chest X-ray (1 View)  06/06/2014   CLINICAL DATA:  Initial evaluation for weakness and mild shortness of breath, personal history of congestive heart failure  EXAM: PORTABLE CHEST - 1 VIEW  COMPARISON:  04/15/2013  FINDINGS: Moderate cardiac enlargement stable. Vascular pattern normal. Lungs clear.  IMPRESSION: Stable moderate cardiac enlargement with no acute findings   Electronically Signed   By: Skipper Cliche M.D.   On: 06/06/2014 15:52    Microbiology: Recent Results (from the past 240 hour(s))  MRSA PCR Screening     Status: None   Collection Time: 06/06/14  1:53 PM  Result Value Ref Range Status   MRSA by PCR NEGATIVE NEGATIVE Final    Comment:        The GeneXpert MRSA Assay (FDA approved for NASAL specimens only), is one component of a comprehensive MRSA colonization surveillance program. It is not intended to diagnose MRSA infection nor to guide or monitor treatment for MRSA infections.      Labs: Basic Metabolic Panel:  Recent Labs Lab 06/06/14 1909  06/07/14 0230 06/07/14 0730 06/07/14 1148 06/08/14 0326 06/09/14 0710  NA 138  < > 139 139 137  135* 138  K 3.9  < > 3.5* 4.0 4.4 3.8 5.0  CL 99  < > 99 100 99 98 100  CO2 24  < > $R'27 25 25 24 27  'Vc$ GLUCOSE 172*  < > 144* 131* 156* 264* 252*  BUN 26*  < > 25* $Rem'22 20 18 17  'wmvu$ CREATININE 1.51*  < > 1.57* 1.43* 1.36* 1.30 1.20  CALCIUM 9.5  < > 9.4 9.2 9.1 8.9 9.1  MG 2.3  --   --   --   --   --   --   < > =  values in this interval not displayed. Liver Function Tests:  Recent Labs Lab 06/05/14 2345  AST 31  ALT 54*  ALKPHOS 135*  BILITOT 0.4  PROT 8.6*  ALBUMIN 4.1   No results for input(s): LIPASE, AMYLASE in the last 168 hours. No results for input(s): AMMONIA in the last 168 hours. CBC:  Recent Labs Lab 06/05/14 2345 06/08/14 0326 06/09/14 0710  WBC 9.0 7.0 4.9  HGB 15.3 13.9 13.3  HCT 46.4 41.6 40.5  MCV 83.3 82.2 83.2  PLT 154 166 154   Cardiac Enzymes:  Recent Labs Lab 06/06/14 1909 06/07/14 0230  TROPONINI <0.30 <0.30   BNP: BNP (last 3 results)  Recent Labs  06/06/14 0500  PROBNP 156.9*   CBG:  Recent Labs Lab 06/08/14 1216 06/08/14 1643 06/08/14 2108 06/09/14 0738 06/09/14 1136  GLUCAP 330* 340* 229* 268* 320*       Signed:  THOMPSON,DANIEL MD [facility] 06/09/2014, 5:23 PM

## 2014-06-09 NOTE — Plan of Care (Signed)
Problem: Phase II Progression Outcomes Goal: Progress activity as tolerated unless otherwise ordered Outcome: Completed/Met Date Met:  06/09/14 Goal: IV changed to normal saline lock Outcome: Completed/Met Date Met:  06/09/14

## 2014-06-09 NOTE — Progress Notes (Signed)
Pt. Discharged home with wife. Prescriptions given, pt hemodynamically stable. Pt and wife verbalized signs and symptoms of worsening condition and when to call the doctor.    Ok AnisSanders,Salote Weidmann A, RN 06/09/2014 5:52 PM

## 2014-10-16 ENCOUNTER — Encounter: Payer: Self-pay | Admitting: Nurse Practitioner

## 2014-12-04 ENCOUNTER — Encounter (HOSPITAL_COMMUNITY): Payer: Self-pay

## 2014-12-04 ENCOUNTER — Emergency Department (HOSPITAL_COMMUNITY)
Admission: EM | Admit: 2014-12-04 | Discharge: 2014-12-04 | Disposition: A | Discharge status: Home / Self Care | Payer: Medicaid Other | Attending: Emergency Medicine | Admitting: Emergency Medicine

## 2014-12-04 ENCOUNTER — Emergency Department (HOSPITAL_COMMUNITY): Payer: Medicaid Other

## 2014-12-04 DIAGNOSIS — I10 Essential (primary) hypertension: Secondary | ICD-10-CM | POA: Insufficient documentation

## 2014-12-04 DIAGNOSIS — Z79899 Other long term (current) drug therapy: Secondary | ICD-10-CM | POA: Diagnosis not present

## 2014-12-04 DIAGNOSIS — R55 Syncope and collapse: Secondary | ICD-10-CM

## 2014-12-04 DIAGNOSIS — I959 Hypotension, unspecified: Secondary | ICD-10-CM | POA: Diagnosis not present

## 2014-12-04 DIAGNOSIS — J45909 Unspecified asthma, uncomplicated: Secondary | ICD-10-CM | POA: Diagnosis not present

## 2014-12-04 DIAGNOSIS — R630 Anorexia: Secondary | ICD-10-CM | POA: Diagnosis not present

## 2014-12-04 DIAGNOSIS — I429 Cardiomyopathy, unspecified: Secondary | ICD-10-CM

## 2014-12-04 DIAGNOSIS — Z9861 Coronary angioplasty status: Secondary | ICD-10-CM | POA: Insufficient documentation

## 2014-12-04 DIAGNOSIS — I2510 Atherosclerotic heart disease of native coronary artery without angina pectoris: Secondary | ICD-10-CM | POA: Diagnosis not present

## 2014-12-04 DIAGNOSIS — Z794 Long term (current) use of insulin: Secondary | ICD-10-CM | POA: Insufficient documentation

## 2014-12-04 DIAGNOSIS — I509 Heart failure, unspecified: Secondary | ICD-10-CM | POA: Insufficient documentation

## 2014-12-04 DIAGNOSIS — R5383 Other fatigue: Secondary | ICD-10-CM | POA: Diagnosis present

## 2014-12-04 DIAGNOSIS — Z8669 Personal history of other diseases of the nervous system and sense organs: Secondary | ICD-10-CM | POA: Insufficient documentation

## 2014-12-04 DIAGNOSIS — Z87891 Personal history of nicotine dependence: Secondary | ICD-10-CM | POA: Diagnosis not present

## 2014-12-04 DIAGNOSIS — R42 Dizziness and giddiness: Secondary | ICD-10-CM | POA: Insufficient documentation

## 2014-12-04 LAB — BASIC METABOLIC PANEL
Anion gap: 9 (ref 5–15)
BUN: 17 mg/dL (ref 6–20)
CO2: 26 mmol/L (ref 22–32)
CREATININE: 1.95 mg/dL — AB (ref 0.61–1.24)
Calcium: 8.7 mg/dL — ABNORMAL LOW (ref 8.9–10.3)
Chloride: 105 mmol/L (ref 101–111)
GFR calc Af Amer: 44 mL/min — ABNORMAL LOW (ref >60)
GFR calc non Af Amer: 38 mL/min — ABNORMAL LOW (ref >60)
Glucose, Bld: 118 mg/dL — ABNORMAL HIGH (ref 65–99)
Potassium: 3.4 mmol/L — ABNORMAL LOW (ref 3.5–5.1)
Sodium: 140 mmol/L (ref 135–145)

## 2014-12-04 LAB — CBC
HEMATOCRIT: 38.2 % — AB (ref 39.0–52.0)
HEMOGLOBIN: 12.5 g/dL — AB (ref 13.0–17.0)
MCH: 28.5 pg (ref 26.0–34.0)
MCHC: 32.7 g/dL (ref 30.0–36.0)
MCV: 87.2 fL (ref 78.0–100.0)
Platelets: 148 10*3/uL — ABNORMAL LOW (ref 150–400)
RBC: 4.38 MIL/uL (ref 4.22–5.81)
RDW: 13.3 % (ref 11.5–15.5)
WBC: 9.5 10*3/uL (ref 4.0–10.5)

## 2014-12-04 LAB — I-STAT TROPONIN, ED: Troponin i, poc: 0.05 ng/mL (ref 0.00–0.08)

## 2014-12-04 LAB — CBG MONITORING, ED: GLUCOSE-CAPILLARY: 90 mg/dL (ref 65–99)

## 2014-12-04 NOTE — Consult Note (Signed)
CARDIOLOGY CONSULT NOTE  Patient ID: Joel Silva, MRN: [id number], DOB/AGE: 52/15/1962 52 y.o. Admit date: 12/04/2014 Date of Consult: 12/04/2014  Primary Physician: Berkley Harvey, NP Primary Cardiologist: Seen in [place] Referring Physician: Dr Olene Floss  Chief Complaint: Lightheadedness/weakness Reason for Consultation: Abnormal EKG  HPI: 54 year old gentleman with history of severe nonischemic cardiomyopathy, presenting with weakness and near syncope. 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 Notes the temperature was greater than 90. He has not been eating and drinking as well. He developed diaphoresis and lightheadedness. He did not have any chest pain or shortness of breath. He became increasingly weak and went to sit down. He rested for a little while and then eventually contacted EMS because he continued to feel poorly. He was brought into the emergency department for further evaluation. He otherwise has been in his baseline state of health without complaints. He has received IV fluids and at the time of my evaluation he states that he feels fine and has no specific complaints. Note the patient underwent cardiac catheterization in 2012 and had normal coronary arteries.  Medical History:  Past Medical History  Diagnosis Date  . Hypertension   . CHF (congestive heart failure)   . Sleep apnea   . Sleep apnea   . Coronary artery disease   . Asthma       Surgical History:  Past Surgical History  Procedure Laterality Date  . Coronary angioplasty with stent placement    . Appendectomy       Home Meds: Prior to Admission medications   Medication Sig Start Date End Date Taking? Authorizing Provider  albuterol (PROVENTIL HFA;VENTOLIN HFA) 108 (90 BASE) MCG/ACT inhaler Inhale 2 puffs into the lungs every 6 (six) hours as needed for wheezing.   Yes Historical Provider, MD  atorvastatin (LIPITOR) 40 MG tablet Take 40 mg by mouth daily.   Yes Historical Provider, MD  bumetanide  (BUMEX) 2 MG tablet Take 2 mg by mouth daily.   Yes Historical Provider, MD  carvedilol (COREG) 12.5 MG tablet Take 12.5 mg by mouth 2 (two) times daily with a meal.   Yes Historical Provider, MD  insulin aspart (NOVOLOG) 100 UNIT/ML FlexPen Inject 6 Units into the skin 3 (three) times daily with meals. Patient taking differently: Inject 4 Units into the skin daily as needed for high blood sugar.  06/09/14  Yes [name], MD  Insulin Glargine (LANTUS SOLOSTAR) 100 UNIT/ML Solostar Pen Inject 42 Units into the skin daily at 10 pm. Patient taking differently: Inject 42 Units into the skin daily as needed.  06/09/14  Yes [name], MD  lisinopril (PRINIVIL,ZESTRIL) 5 MG tablet Take 5 mg by mouth daily.   Yes Historical Provider, MD  spironolactone (ALDACTONE) 25 MG tablet Take 1 tablet by mouth daily. PLEASE SCHEDULE OFFICE VISIT. 04/17/13  Yes [name], MD  Blood Glucose Monitoring Suppl (BLOOD GLUCOSE METER KIT AND SUPPLIES) Dispense based on patient and insurance preference. Use up to four times daily as directed. (FOR ICD-9 250.00, 250.01). 06/09/14   [name], MD  Insulin Pen Needle 31G X 5 MM MISC 6-42 Units by Does not apply route 4 (four) times daily -  before meals and at bedtime. 06/09/14   [name], MD    Inpatient Medications:     Allergies:  Allergies  Allergen Reactions  . Peach Flavor     Swelling of the eyes     History   Social History  .  Marital Status: Married    Spouse Name: N/A  . Number of Children: N/A  . Years of Education: N/A   Occupational History  . Not on file.   Social History Main Topics  . Smoking status: Former Smoker -- 2 years  . Smokeless tobacco: Never Used  . Alcohol Use: 0.6 oz/week    1 Cans of beer per week  . Drug Use: No  . Sexual Activity: Not on file   Other Topics Concern  . Not on file   Social History Narrative    Family history: History reviewed. No premature CAD   Review of  Systems: General: negative for chills, fever, night sweats or weight changes.  ENT: negative for rhinorrhea or epistaxis Cardiovascular: negative for chest pain, shortness of breath, dyspnea on exertion, edema, orthopnea, palpitations, or paroxysmal nocturnal dyspnea Dermatological: negative for rash Respiratory: negative for cough or wheezing GI: negative for nausea, vomiting, diarrhea, bright red blood per rectum, melena, or hematemesis GU: no hematuria, urgency, or frequency Neurologic: Negative for headache Heme: no easy bruising or bleeding Endo: negative for excessive thirst, thyroid disorder, or flushing Musculoskeletal: negative for joint pain or swelling, negative for myalgias  All other systems reviewed and are otherwise negative except as noted above.  Physical Exam: Blood pressure 118/72, pulse 65, temperature 98 F (36.7 C), temperature source Oral, resp. rate 12, SpO2 99 %. Pt is alert and oriented, WD, WN, in no distress. HEENT: normal Neck: JVP normal. Carotid upstrokes normal without bruits. No thyromegaly. Lungs: equal expansion, clear bilaterally CV: Apex is discrete and nondisplaced, RRR without murmur or gallop Abd: soft, NT, +BS, no bruit, no hepatosplenomegaly Back: no CVA tenderness Ext: no C/C/E        DP/PT pulses intact and = Skin: warm and dry without rash Neuro: CNII-XII intact             Strength intact = bilaterally    Labs: No results for input(s): CKTOTAL, CKMB, TROPONINI in the last 72 hours. Lab Results  Component Value Date   WBC 9.5 12/04/2014   HGB 12.5* 12/04/2014   HCT 38.2* 12/04/2014   MCV 87.2 12/04/2014   PLT 148* 12/04/2014    Recent Labs Lab 12/04/14 1459  NA 140  K 3.4*  CL 105  CO2 26  BUN 17  CREATININE 1.95*  CALCIUM 8.7*  GLUCOSE 118*   Lab Results  Component Value Date   CHOL 111 06/08/2014   HDL 37* 06/08/2014   LDLCALC 43 06/08/2014   TRIG 153* 06/08/2014   Lab Results  Component Value Date   DDIMER  0.61* 09/26/2011    Radiology/Studies:  Dg Chest 2 View  12/04/2014   CLINICAL DATA:  Dizziness, hypertension, CHF.  EXAM: CHEST  2 VIEW  COMPARISON:  06/06/2014  FINDINGS: The heart size and mediastinal contours are within normal limits. Both lungs are clear. The visualized skeletal structures are unremarkable.  IMPRESSION: No active cardiopulmonary disease.   Electronically Signed   By: Rolm Baptise M.D.   On: 12/04/2014 14:43    EKG: Normal sinus rhythm, biatrial enlargement, inferolateral ST/T-wave abnormality consider ischemia versus LVH with repolarization abnormality.  ASSESSMENT AND PLAN:  1. Near syncope: Very typical history for volume depletion as he was working in hot temperatures without good oral intake. No ominous signs or symptoms present. The patient quickly responded to IV fluids and seems to be back to his baseline. I think it is reasonable to let him go home from the emergency  department and follow-up as an outpatient with his primary physician and cardiologist. I asked him to push fluids and make sure that he eats frequent snacks. He should stay out of work for a few days to recover.  2. Nonischemic cardiomyopathy without signs of congestive heart failure at present. He is tolerating lisinopril and Aldactone. His creatinine is mildly increased from baseline. Would hold these meds for 48 hours and should have a follow-up metabolic panel next week. Otherwise continue current medical therapy.  SignedSherren Mocha MD, [facility] 12/04/2014, 6:12 PM

## 2014-12-04 NOTE — Discharge Instructions (Signed)
Dehydration, Adult °Dehydration is when you lose more fluids from the body than you take in. Vital organs like the kidneys, brain, and heart cannot function without a proper amount of fluids and salt. Any loss of fluids from the body can cause dehydration.  °CAUSES  °· Vomiting. °· Diarrhea. °· Excessive sweating. °· Excessive urine output. °· Fever. °SYMPTOMS  °Mild dehydration °· Thirst. °· Dry lips. °· Slightly dry mouth. °Moderate dehydration °· Very dry mouth. °· Sunken eyes. °· Skin does not bounce back quickly when lightly pinched and released. °· Dark urine and decreased urine production. °· Decreased tear production. °· Headache. °Severe dehydration °· Very dry mouth. °· Extreme thirst. °· Rapid, weak pulse (more than 100 beats per minute at rest). °· Cold hands and feet. °· Not able to sweat in spite of heat and temperature. °· Rapid breathing. °· Blue lips. °· Confusion and lethargy. °· Difficulty being awakened. °· Minimal urine production. °· No tears. °DIAGNOSIS  °Your caregiver will diagnose dehydration based on your symptoms and your exam. Blood and urine tests will help confirm the diagnosis. The diagnostic evaluation should also identify the cause of dehydration. °TREATMENT  °Treatment of mild or moderate dehydration can often be done at home by increasing the amount of fluids that you drink. It is best to drink small amounts of fluid more often. Drinking too much at one time can make vomiting worse. Refer to the home care instructions below. °Severe dehydration needs to be treated at the hospital where you will probably be given intravenous (IV) fluids that contain water and electrolytes. °HOME CARE INSTRUCTIONS  °· Ask your caregiver about specific rehydration instructions. °· Drink enough fluids to keep your urine clear or pale yellow. °· Drink small amounts frequently if you have nausea and vomiting. °· Eat as you normally do. °· Avoid: °¨ Foods or drinks high in sugar. °¨ Carbonated  drinks. °¨ Juice. °¨ Extremely hot or cold fluids. °¨ Drinks with caffeine. °¨ Fatty, greasy foods. °¨ Alcohol. °¨ Tobacco. °¨ Overeating. °¨ Gelatin desserts. °· Wash your hands well to avoid spreading bacteria and viruses. °· Only take over-the-counter or prescription medicines for pain, discomfort, or fever as directed by your caregiver. °· Ask your caregiver if you should continue all prescribed and over-the-counter medicines. °· Keep all follow-up appointments with your caregiver. °SEEK MEDICAL CARE IF: °· You have abdominal pain and it increases or stays in one area (localizes). °· You have a rash, stiff neck, or severe headache. °· You are irritable, sleepy, or difficult to awaken. °· You are weak, dizzy, or extremely thirsty. °SEEK IMMEDIATE MEDICAL CARE IF:  °· You are unable to keep fluids down or you get worse despite treatment. °· You have frequent episodes of vomiting or diarrhea. °· You have blood or green matter (bile) in your vomit. °· You have blood in your stool or your stool looks black and tarry. °· You have not urinated in 6 to 8 hours, or you have only urinated a small amount of very dark urine. °· You have a fever. °· You faint. °MAKE SURE YOU:  °· Understand these instructions. °· Will watch your condition. °· Will get help right away if you are not doing well or get worse. °Document Released: 06/22/2005 Document Revised: 09/14/2011 Document Reviewed: 02/09/2011 °ExitCare® Patient Information ©2015 [employer]. This information is not intended to replace advice given to you by your health care provider. Make sure you discuss any questions you have with your health care   provider. ° °Near-Syncope °Near-syncope (commonly known as near fainting) is sudden weakness, dizziness, or feeling like you might pass out. During an episode of near-syncope, you may also develop pale skin, have tunnel vision, or feel sick to your stomach (nauseous). Near-syncope may occur when getting up after sitting or  while standing for a long time. It is caused by a sudden decrease in blood flow to the brain. This decrease can result from various causes or triggers, most of which are not serious. However, because near-syncope can sometimes be a sign of something serious, a medical evaluation is required. The specific cause is often not determined. °HOME CARE INSTRUCTIONS  °Monitor your condition for any changes. The following actions may help to alleviate any discomfort you are experiencing: °· Have someone stay with you until you feel stable. °· Lie down right away and prop your feet up if you start feeling like you might faint. Breathe deeply and steadily. Wait until all the symptoms have passed. Most of these episodes last only a few minutes. You may feel tired for several hours.   °· Drink enough fluids to keep your urine clear or pale yellow.   °· If you are taking blood pressure or heart medicine, get up slowly when seated or lying down. Take several minutes to sit and then stand. This can reduce dizziness. °· Follow up with your health care provider as directed.  °SEEK IMMEDIATE MEDICAL CARE IF:  °· You have a severe headache.   °· You have unusual pain in the chest, abdomen, or back.   °· You are bleeding from the mouth or rectum, or you have black or tarry stool.   °· You have an irregular or very fast heartbeat.   °· You have repeated fainting or have seizure-like jerking during an episode.   °· You faint when sitting or lying down.   °· You have confusion.   °· You have difficulty walking.   °· You have severe weakness.   °· You have vision problems.   °MAKE SURE YOU:  °· Understand these instructions. °· Will watch your condition. °· Will get help right away if you are not doing well or get worse. °Document Released: 06/22/2005 Document Revised: 06/27/2013 Document Reviewed: 11/25/2012 °ExitCare® Patient Information ©2015 [employer]. This information is not intended to replace advice given to you by your health  care provider. Make sure you discuss any questions you have with your health care provider. ° °

## 2014-12-04 NOTE — ED Notes (Signed)
Per EMS, pt [place] to [place] in [place] [employer] [facility] [place] and stated "I don't feel well."  Per EMS pt was very diaphoretic and c/o weakness.  Pt was working this morning in the heat and reported he did not drink any fluid today.  Cardiology at bedside upon pt arrival.

## 2014-12-04 NOTE — ED Notes (Signed)
Pt given ice chips

## 2014-12-04 NOTE — ED Notes (Signed)
MD at bedside. 

## 2014-12-04 NOTE — ED Provider Notes (Signed)
CSN: [id number]     Arrival date & time 12/04/14  1348 History   First MD Initiated Contact with Patient 12/04/14 1350     Chief Complaint  Patient presents with  . 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 Reports decreased by mouth intake. Temperature 90 plus. Reports having onset of lightheadedness and dizziness sweating. No sacral episode. No chest pain or shortness of breath associated with this.  Patient is a 54 y.o. male presenting with dizziness.  Dizziness Quality:  Lightheadedness Onset quality:  Sudden Timing:  Intermittent Progression:  Resolved Chronicity:  New Context: physical activity   Associated symptoms: no chest pain and no shortness of breath     Past Medical History  Diagnosis Date  . Hypertension   . CHF (congestive heart failure)   . Sleep apnea   . Sleep apnea   . Coronary artery disease   . Asthma    Past Surgical History  Procedure Laterality Date  . Coronary angioplasty with stent placement    . Appendectomy     History reviewed. No pertinent family history. History  Substance Use Topics  . Smoking status: Former Smoker -- 2 years  . Smokeless tobacco: Never Used  . Alcohol Use: 0.6 oz/week    1 Cans of beer per week    Review of Systems  Constitutional: Positive for fatigue.  Respiratory: Negative for shortness of breath.   Cardiovascular: Negative for chest pain.  Gastrointestinal: Negative for abdominal pain.  Musculoskeletal: Negative for back pain.  Skin: Negative for rash.  Neurological: Positive for dizziness and light-headedness.  Psychiatric/Behavioral: Negative for confusion.  All other systems reviewed and are negative.     Allergies  Peach flavor  Home Medications   Prior to Admission medications   Medication Sig Start Date End Date Taking? Authorizing Provider  albuterol (PROVENTIL  HFA;VENTOLIN HFA) 108 (90 BASE) MCG/ACT inhaler Inhale 2 puffs into the lungs every 6 (six) hours as needed for wheezing.   Yes Historical Provider, MD  atorvastatin (LIPITOR) 40 MG tablet Take 40 mg by mouth daily.   Yes Historical Provider, MD  bumetanide (BUMEX) 2 MG tablet Take 2 mg by mouth daily.   Yes Historical Provider, MD  carvedilol (COREG) 12.5 MG tablet Take 12.5 mg by mouth 2 (two) times daily with a meal.   Yes Historical Provider, MD  insulin aspart (NOVOLOG) 100 UNIT/ML FlexPen Inject 6 Units into the skin 3 (three) times daily with meals. Patient taking differently: Inject 4 Units into the skin daily as needed for high blood sugar.  06/09/14  Yes [name], MD  Insulin Glargine (LANTUS SOLOSTAR) 100 UNIT/ML Solostar Pen Inject 42 Units into the skin daily at 10 pm. Patient taking differently: Inject 42 Units into the skin daily as needed.  06/09/14  Yes [name], MD  lisinopril (PRINIVIL,ZESTRIL) 5 MG tablet Take 5 mg by mouth daily.   Yes Historical Provider, MD  spironolactone (ALDACTONE) 25 MG tablet Take 1 tablet by mouth daily. PLEASE SCHEDULE OFFICE VISIT. 04/17/13  Yes [name], MD  Blood Glucose Monitoring Suppl (BLOOD GLUCOSE METER KIT AND SUPPLIES) Dispense based on patient and insurance preference. Use up to four times daily as directed. (FOR ICD-9 250.00, 250.01). 06/09/14   [name], MD  Insulin Pen Needle 31G X 5 MM MISC 6-42 Units by Does not apply route 4 (four) times daily -  before meals and at bedtime.  06/09/14   Rodolph Bong, MD   BP 112/35 mmHg  Pulse 60  Temp(Src) 98.2 F (36.8 C) (Oral)  Resp 17  SpO2 98% Physical Exam  Constitutional: He is oriented to person, place, and time. He appears well-developed.  HENT:  Head: Normocephalic and atraumatic.  Eyes: Pupils are equal, round, and reactive to light.  Neck: Normal range of motion.  Cardiovascular: Normal rate and intact distal pulses.   No murmur  heard. Pulmonary/Chest: Effort normal.  Abdominal: Soft. He exhibits no distension. There is no tenderness.  Musculoskeletal: He exhibits no edema.  Neurological: He is alert and oriented to person, place, and time. No cranial nerve deficit. He exhibits normal muscle tone.  Skin: Skin is warm.  Psychiatric: He has a normal mood and affect.  Vitals reviewed.   ED Course  Procedures (including critical care time) Labs Review Labs Reviewed  CBC - Abnormal; Notable for the following:    Hemoglobin 12.5 (*)    HCT 38.2 (*)    Platelets 148 (*)    All other components within normal limits  BASIC METABOLIC PANEL - Abnormal; Notable for the following:    Potassium 3.4 (*)    Glucose, Bld 118 (*)    Creatinine, Ser 1.95 (*)    Calcium 8.7 (*)    GFR calc non Af Amer 38 (*)    GFR calc Af Amer 44 (*)    All other components within normal limits  I-STAT TROPOININ, ED  CBG MONITORING, ED    Imaging Review Dg Chest 2 View  12/04/2014   CLINICAL DATA:  Dizziness, hypertension, CHF.  EXAM: CHEST  2 VIEW  COMPARISON:  06/06/2014  FINDINGS: The heart size and mediastinal contours are within normal limits. Both lungs are clear. The visualized skeletal structures are unremarkable.  IMPRESSION: No active cardiopulmonary disease.   Electronically Signed   By: Charlett Nose M.D.   On: 12/04/2014 14:43     EKG Interpretation   Date/Time:  Tuesday Dec 04 2014 13:52:14 EDT Ventricular Rate:  76 PR Interval:  182 QRS Duration: 114 QT Interval:  431 QTC Calculation: 485 R Axis:   -20 Text Interpretation:  Sinus rhythm Atrial premature complexes LAE,  consider biatrial enlargement Borderline intraventricular conduction delay  Borderline repolarization abnormality ST elevation, consider anterior  injury Borderline prolonged QT interval t wave inversions now present in  v6, with ? STD in inferior leads, new since prior Confirmed by Mirian Mo ([id number]) on 12/04/2014 3:43:17 PM      MDM   54 year old male arrives with dizziness lightheadedness diaphoresis. EMS initially called in cardiology was present on arrival with concern for ST changes concerning for an MI. However on formal EKG here patient has nonspecific ST changes which are not present on previous EKG, however no clear ST depressions or elevations. Concern whether this was cardiac related versus dehydration as patient does report being outside and decreased by mouth intake. Initial labs obtained. CBC BMP unremarkable aside from slight elevation of creatinine likely secondary to dehydration. IV fluids given here. Patient hypotension with EMS is now hours all with fluids. Initial troponin 0.05. However given the patient has multiple cardiac risk factors include diabetes, hypertension, hyperlipidemia and previous balloon angioplasty and new nonspecific EKG changes we will admit to cardiology for rule out.  Final diagnoses:  Dizziness  Hypotension, unspecified hypotension type        Bridgett Larsson, MD 12/04/14 1555  Mirian Mo, MD 12/08/14 1540

## 2014-12-28 ENCOUNTER — Encounter: Payer: Self-pay | Admitting: Gastroenterology

## 2015-12-15 IMAGING — CR DG CHEST 1V PORT
1 series · 1 of 1 positions shown · non-contrast
Comparison: 04/15/2013

CLINICAL DATA: Initial evaluation for weakness and mild shortness
of breath, personal history of congestive heart failure

EXAM:
PORTABLE CHEST - 1 VIEW

[AP]
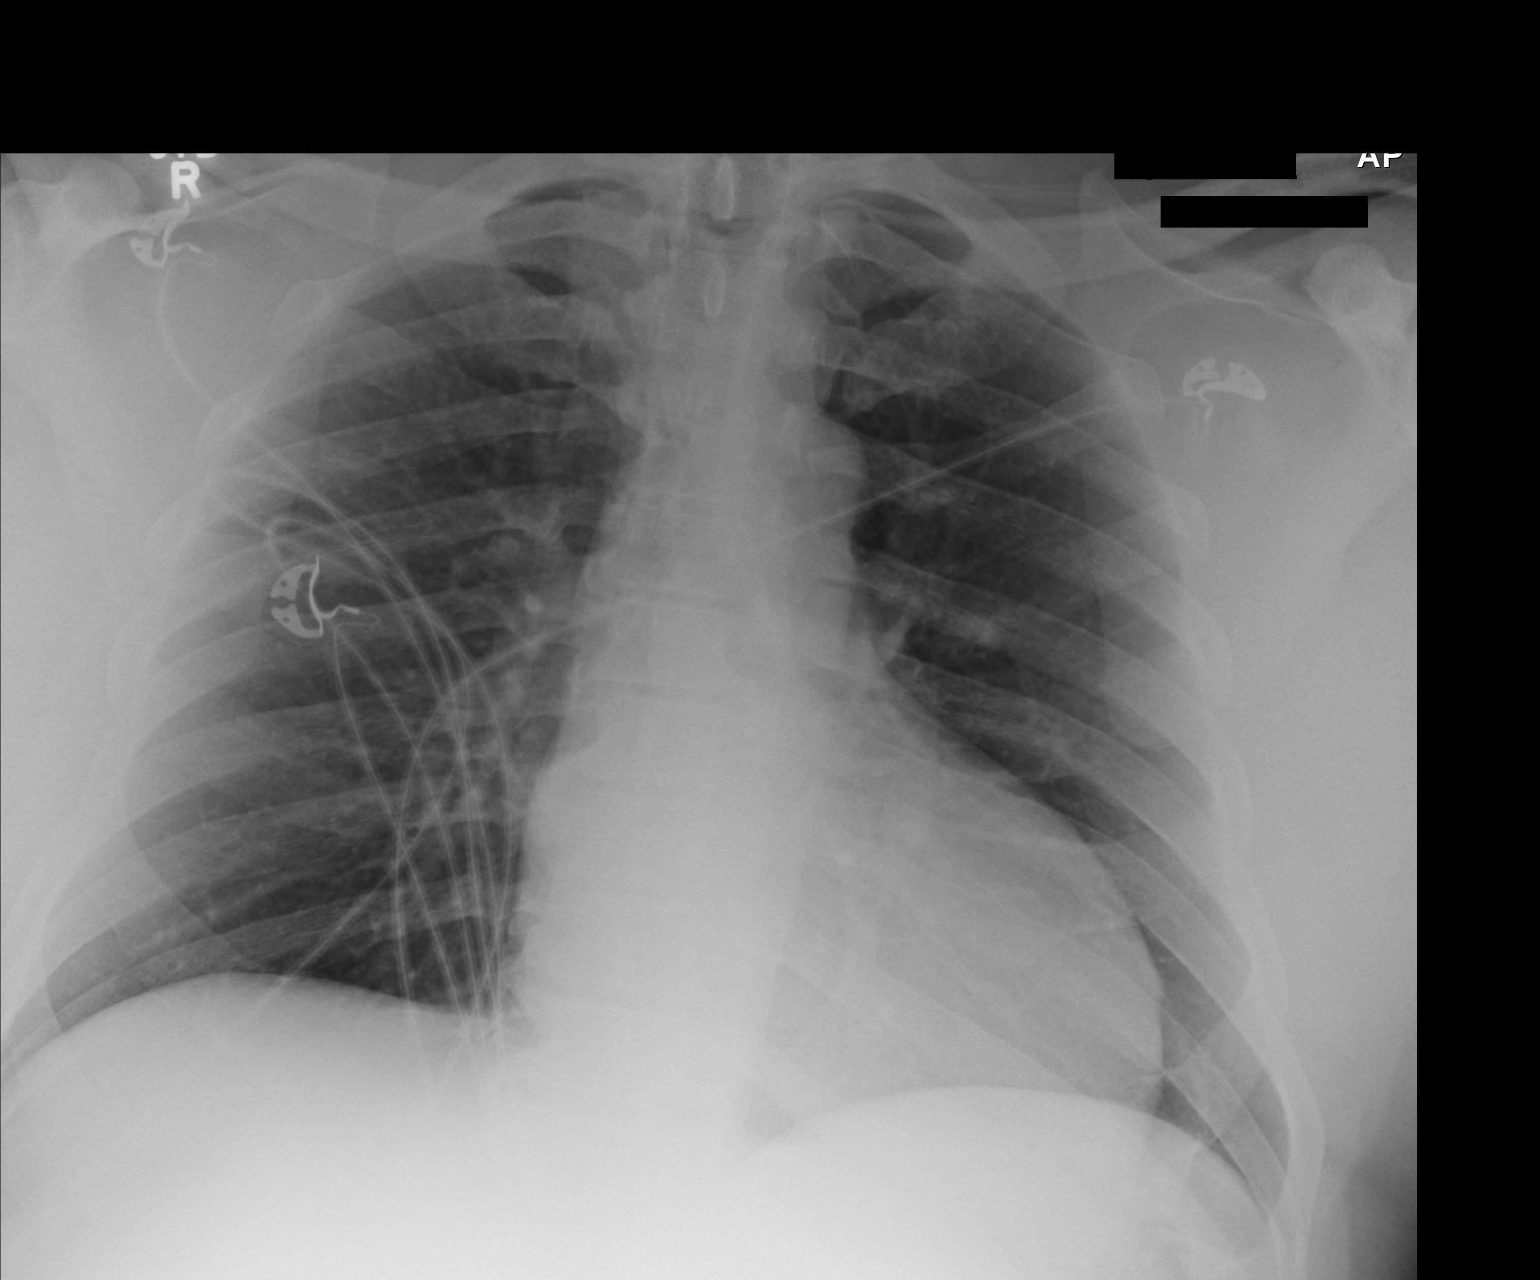

[1 of 1 positions shown; findings below may reference images not displayed]

FINDINGS: Moderate cardiac enlargement stable. Vascular pattern normal. Lungs
clear.
IMPRESSION: Stable moderate cardiac enlargement with no acute findings

## 2016-06-13 IMAGING — CR DG CHEST 2V
2 series · 2 of 2 positions shown · non-contrast
Comparison: 06/06/2014

CLINICAL DATA: Dizziness, hypertension, CHF.

EXAM:
CHEST  2 VIEW

[chest pa]
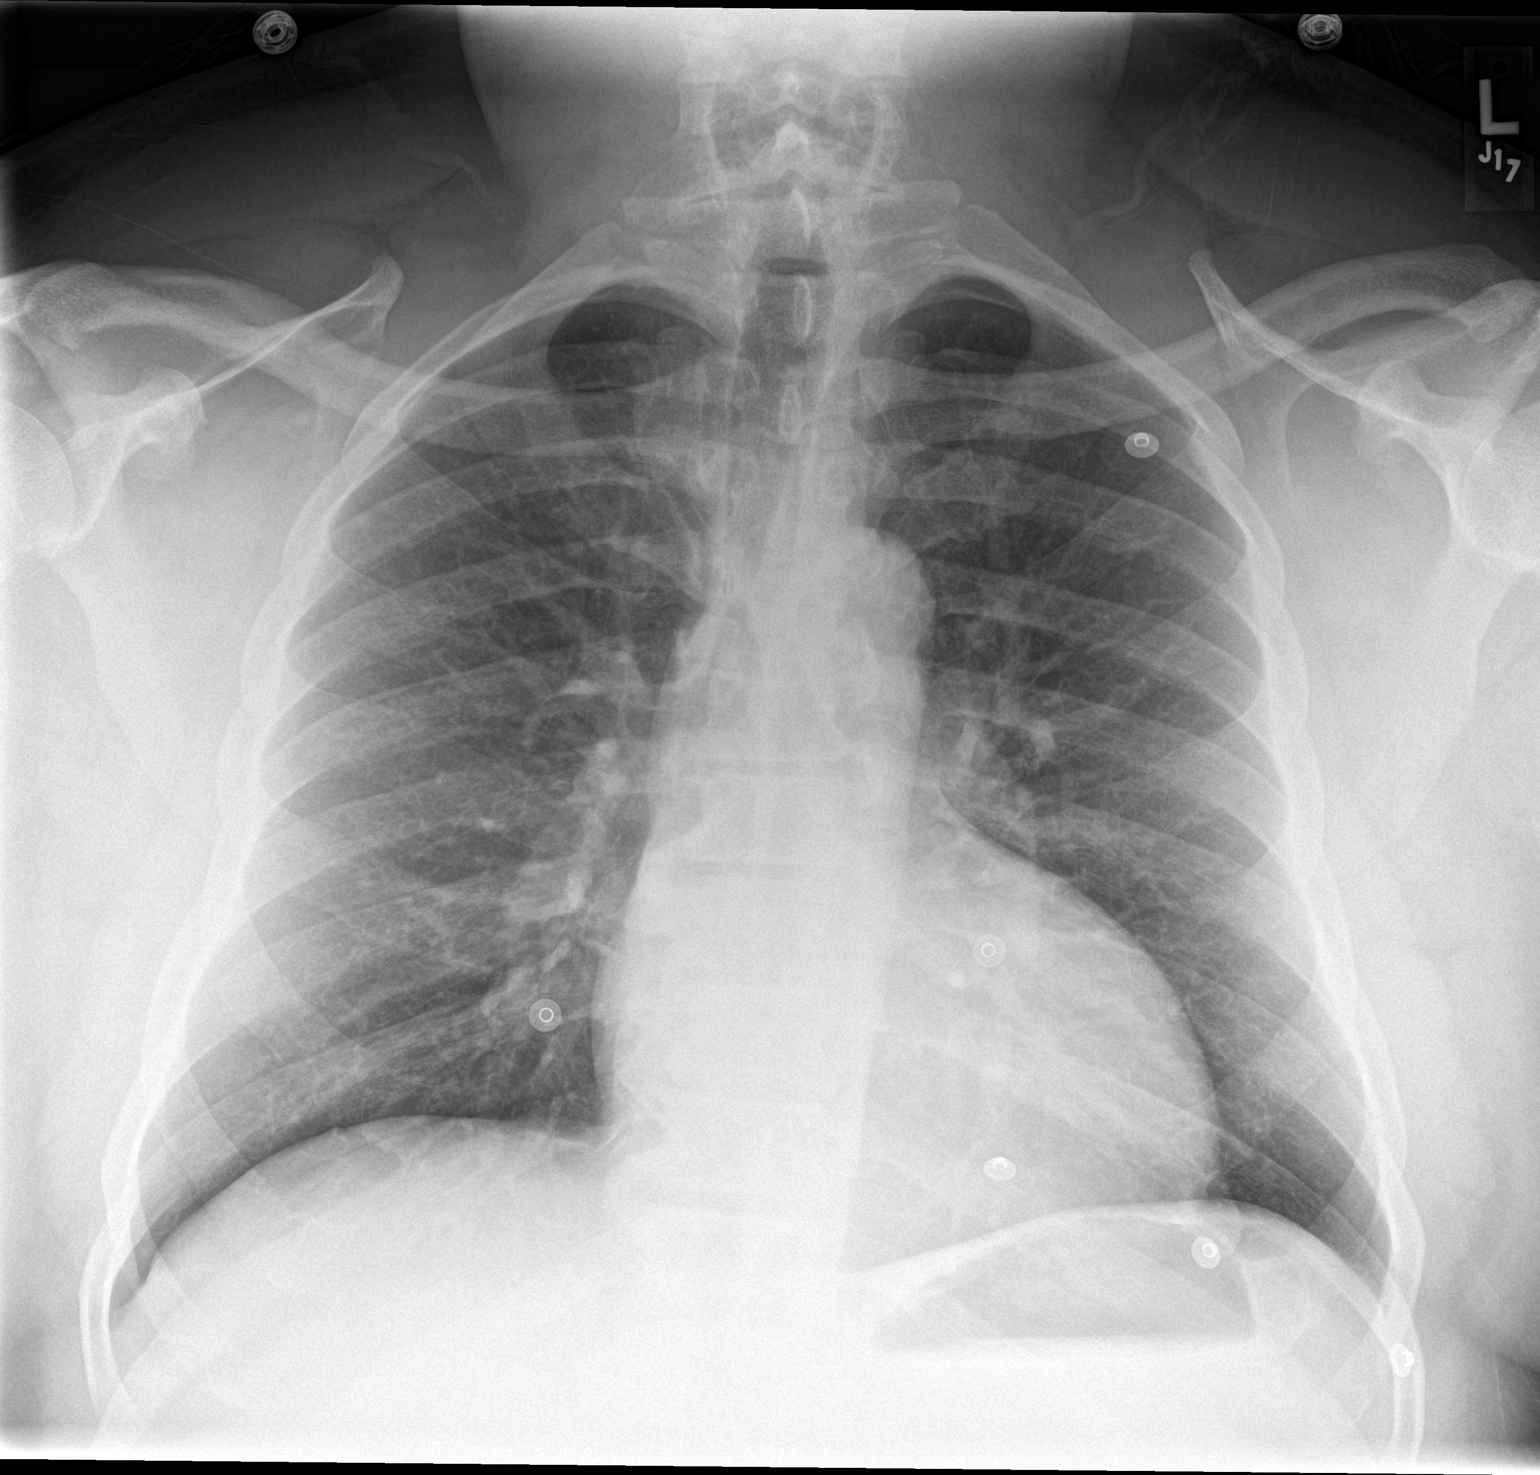

[chest lat]
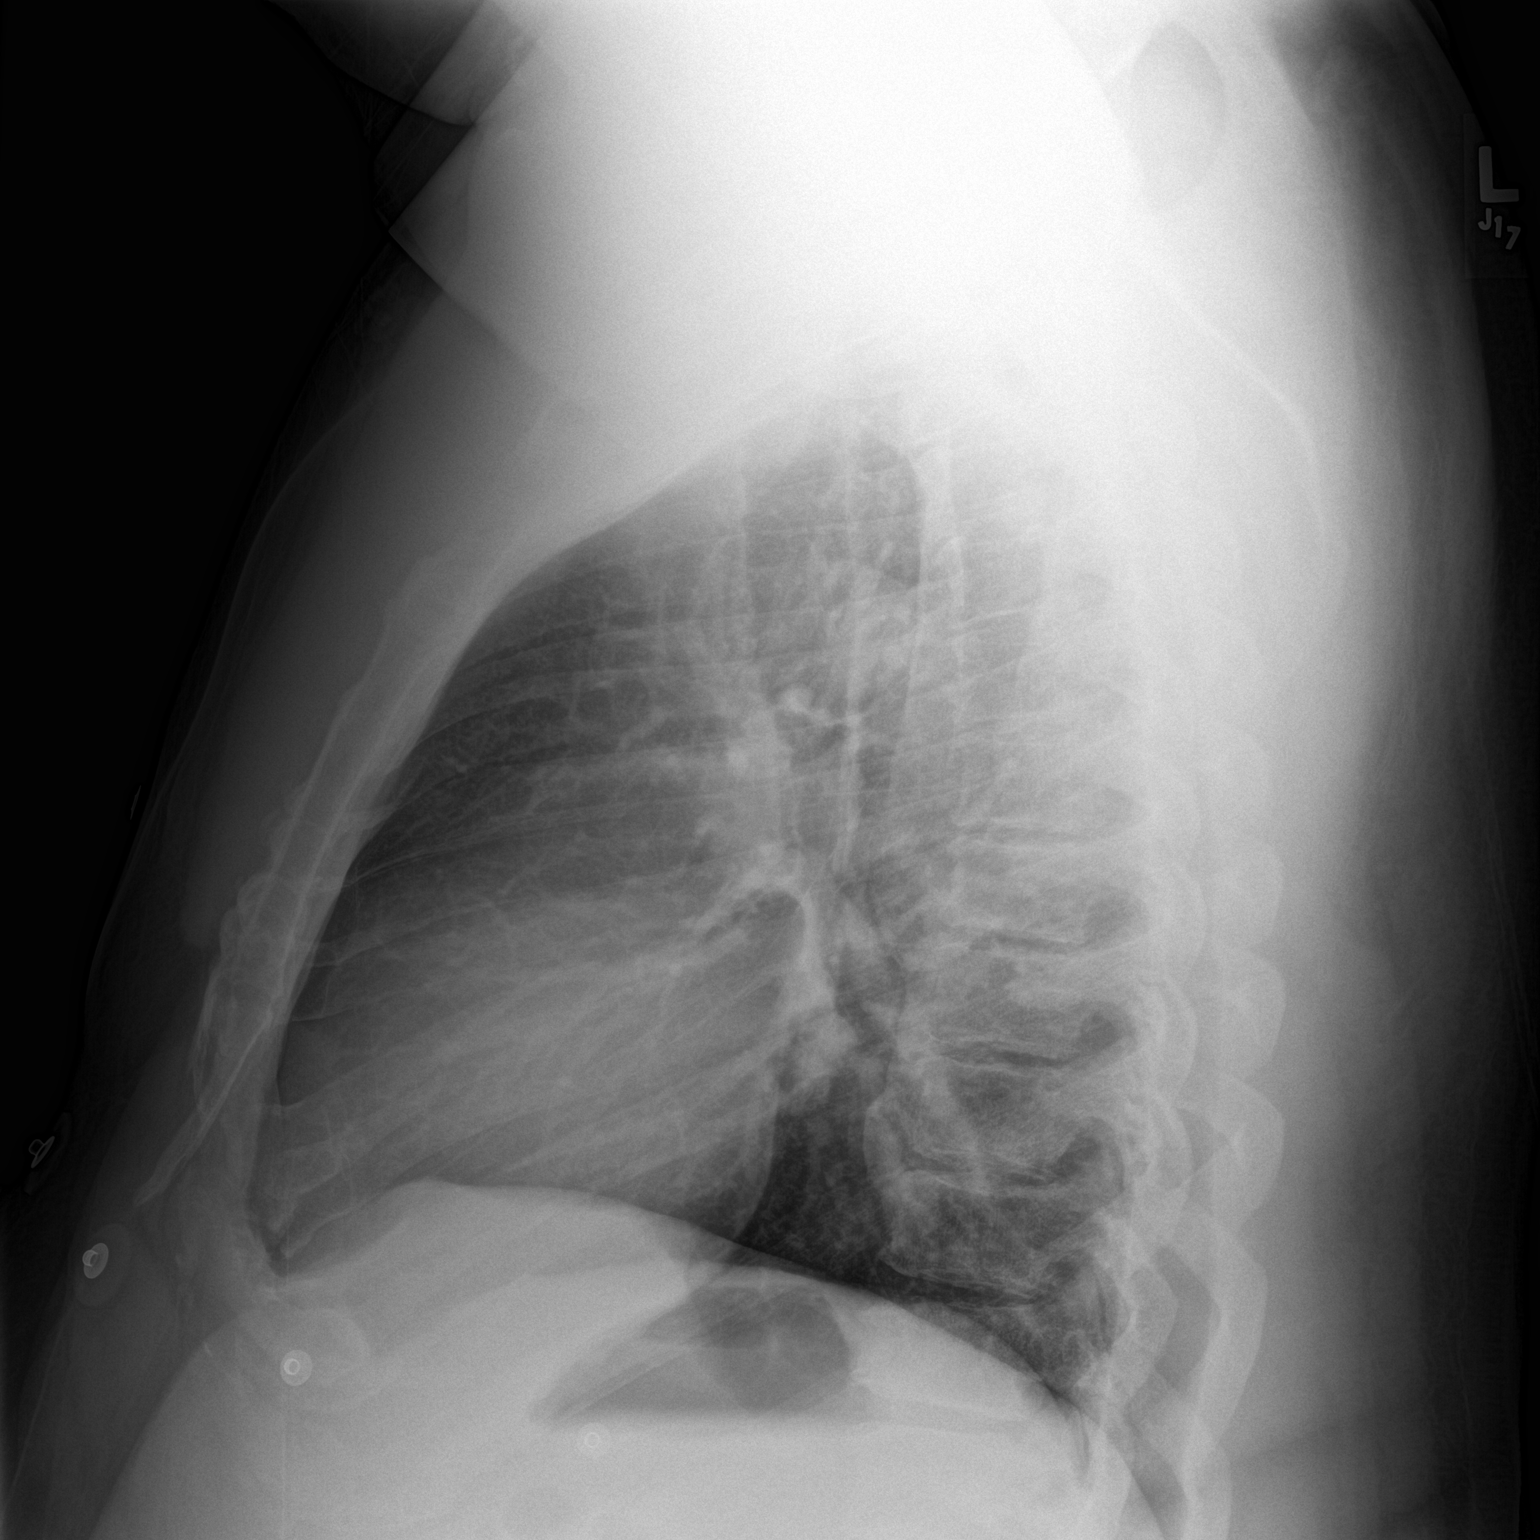

[2 of 2 positions shown; findings below may reference images not displayed]

FINDINGS: The heart size and mediastinal contours are within normal limits.
Both lungs are clear. The visualized skeletal structures are
unremarkable.
IMPRESSION: No active cardiopulmonary disease.

## 2017-08-06 DEATH — deceased
